# Patient Record
Sex: Female | Born: 1975 | Hispanic: Yes | Marital: Married | Smoking: Never smoker
Health system: Southern US, Community
[De-identification: ages and names within clinical notes are randomized; demographics above are authoritative.]

## PROBLEM LIST (undated history)

## (undated) DIAGNOSIS — Z789 Other specified health status: Secondary | ICD-10-CM

## (undated) HISTORY — PX: TUBAL LIGATION: SHX77

## (undated) HISTORY — PX: NO PAST SURGERIES: SHX2092

---

## 1998-08-11 ENCOUNTER — Inpatient Hospital Stay (INDEPENDENT_AMBULATORY_CARE_PROVIDER_SITE_OTHER): Admit: 1998-08-11 | Disposition: A | Discharge status: Home / Self Care | Payer: Self-pay | Source: Ambulatory Visit

## 1998-09-13 ENCOUNTER — Inpatient Hospital Stay (HOSPITAL_BASED_OUTPATIENT_CLINIC_OR_DEPARTMENT_OTHER)
Admission: RE | Admit: 1998-09-13 | Disposition: A | Discharge status: Home / Self Care | Payer: Self-pay | Source: Ambulatory Visit

## 2007-05-21 ENCOUNTER — Inpatient Hospital Stay (INDEPENDENT_AMBULATORY_CARE_PROVIDER_SITE_OTHER)
Admit: 2007-05-21 | Disposition: A | Discharge status: Home / Self Care | Payer: Self-pay | Source: Ambulatory Visit | Admitting: Obstetrics & Gynecology

## 2007-05-21 LAB — CBC AND DIFFERENTIAL
Basophils Absolute: 0 /mm3 (ref 0.0–0.2)
Basophils: 0 % (ref 0–2)
Eosinophils Absolute: 0.1 /mm3 (ref 0.0–0.7)
Eosinophils: 1 % (ref 0–5)
Granulocytes Absolute: 5.1 /mm3 (ref 1.8–8.1)
Hematocrit: 42.8 % (ref 37.0–47.0)
Hgb: 14.3 G/DL (ref 12.0–16.0)
Immature Granulocytes Absolute: 0
Immature Granulocytes: 0 %
Lymphocytes Absolute: 1.3 /mm3 (ref 0.5–4.4)
Lymphocytes: 19 % (ref 15–41)
MCH: 29.4 PG (ref 28.0–32.0)
MCHC: 33.4 G/DL (ref 32.0–36.0)
MCV: 87.9 FL (ref 80.0–100.0)
MPV: 10.8 FL (ref 9.4–12.3)
Monocytes Absolute: 0.6 /mm3 (ref 0.0–1.2)
Monocytes: 8 % (ref 0–11)
Neutrophils %: 72 % (ref 52–75)
Platelets: 219 /mm3 (ref 140–400)
RBC: 4.87 /mm3 (ref 4.20–5.40)
RDW: 13.1 % (ref 11.5–15.0)
WBC: 7.11 /mm3 (ref 3.50–10.80)

## 2007-05-21 LAB — PRENATAL  WORKUP
AB Screen Gel: NEGATIVE
ABO Rh: O POS

## 2007-05-21 LAB — SICKLE CELL SCREEN: Sickle Screen Test: NEGATIVE

## 2007-05-21 LAB — HEPATITIS B SURFACE ANTIGEN W/ REFLEX TO CONFIRMATION: Hepatitis B Surface Antigen: NEGATIVE

## 2007-05-21 LAB — HIV RAPID: HIV Rapid: NONREACTIVE

## 2007-05-23 LAB — RPR: RPR: NONREACTIVE

## 2007-05-26 LAB — RUBELLA ANTIBODY, IGG

## 2007-06-04 LAB — TRIPLE SCREEN(SOFT)

## 2007-07-02 LAB — TRIPLE SCREEN(SOFT)

## 2007-08-10 ENCOUNTER — Emergency Department: Admit: 2007-08-10 | Payer: Self-pay | Source: Emergency Department | Admitting: Emergency Medicine

## 2007-09-04 LAB — GLUCOSE CHALLENGE: Glucose Challenge: 92 mg/dL

## 2007-09-17 LAB — URINALYSIS
Bilirubin, UA: NEGATIVE
Blood, UA: NEGATIVE
Glucose, UA: NEGATIVE
Ketones UA: NEGATIVE
Leukocyte Esterase, UA: NEGATIVE
Nitrite, UA: NEGATIVE
Protein, UR: NEGATIVE
Specific Gravity UA POCT: 1.003 (ref 1.001–1.035)
Urine pH: 6 (ref 5.0–8.0)
Urobilinogen, UA: NORMAL mg/dL

## 2007-11-10 ENCOUNTER — Observation Stay (HOSPITAL_BASED_OUTPATIENT_CLINIC_OR_DEPARTMENT_OTHER)
Admission: RE | Admit: 2007-11-10 | Disposition: A | Discharge status: Home / Self Care | Payer: Self-pay | Source: Ambulatory Visit

## 2007-11-16 ENCOUNTER — Observation Stay (HOSPITAL_BASED_OUTPATIENT_CLINIC_OR_DEPARTMENT_OTHER)
Admission: AD | Admit: 2007-11-16 | Disposition: A | Discharge status: Home / Self Care | Payer: Self-pay | Source: Ambulatory Visit | Admitting: Obstetrics & Gynecology

## 2007-11-16 LAB — URINALYSIS WITH MICROSCOPIC
Bilirubin, UA: NEGATIVE
Blood, UA: NEGATIVE
Glucose, UA: NEGATIVE
Ketones UA: NEGATIVE
Leukocyte Esterase, UA: NEGATIVE
Nitrite, UA: NEGATIVE
Protein, UR: NEGATIVE
RBC, UA: <=1 /HPF (ref 0–?)
Specific Gravity UA POCT: 1.003 (ref 1.001–1.035)
Squamous Epithelial Cells, Urine: 1 /LPF
Urine pH: 6.5 (ref 5.0–8.0)
Urobilinogen, UA: NORMAL mg/dL
WBC, UA: <=1 /HPF (ref 0–?)

## 2007-11-29 ENCOUNTER — Inpatient Hospital Stay (HOSPITAL_BASED_OUTPATIENT_CLINIC_OR_DEPARTMENT_OTHER)
Admission: RE | Admit: 2007-11-29 | Disposition: A | Discharge status: Home / Self Care | Payer: Self-pay | Source: Ambulatory Visit

## 2007-11-29 LAB — CBC
Hematocrit: 35.5 % — ABNORMAL LOW (ref 37.0–47.0)
Hgb: 11.4 G/DL — ABNORMAL LOW (ref 12.0–16.0)
MCH: 25.7 PG — ABNORMAL LOW (ref 28.0–32.0)
MCHC: 32.1 G/DL (ref 32.0–36.0)
MCV: 80.1 FL (ref 80.0–100.0)
MPV: 11.4 FL (ref 9.4–12.3)
Platelets: 206 /mm3 (ref 140–400)
RBC: 4.43 /mm3 (ref 4.20–5.40)
RDW: 14.7 % (ref 11.5–15.0)
WBC: 8.39 /mm3 (ref 3.50–10.80)

## 2010-12-28 NOTE — H&P (Unsigned)
DATE OF BIRTH:                        01-17-1976      ADMISSION DATE:                     05/21/2007            PATIENT LOCATION:                     [facility]            ATTENDING PHYSICIAN:                  Karolee Stamps, MD            HISTORY OF PRESENT ILLNESS:  This is a 35 year old Hispanic female gravida      5, para 4, last menstrual period 01/24/2007, [facility] 10/31/2007 at      approximately 18 weeks and 4 days who presents for her initial prenatal      care visit.  Patient denied any bleeding, contractions, or leakage of      fluid.  Significant to her history is the fact that she has had no prior      prenatal care.            OBSTETRIC HISTORY:  Her obstetric history consists of 4 prior vaginal      deliveries.  Her first delivery was in 1998 for a 9-pound 1/2-ounce female.      Second delivery was in 2000, a 5-pound female vaginal delivery.  In 2005 she      had a vaginal delivery of an 8-pound 5-ounce female, and in 2006 she had a      vaginal delivery of a 7-pound 5-ounce female.            PAST MEDICAL HISTORY:  She has no medical disorders.  No previous surgery.            ALLERGIES:  No known allergies.            FAMILY HISTORY:  Unremarkable.            SOCIAL HISTORY:  Denies alcohol, drug, or tobacco usage.            REVIEW OF SYSTEMS:  Negative for bleeding, leakage of fluid, or      contractions.            PHYSICAL EXAMINATION:  On admission blood pressure was 105/66, pulse 72,      respirations 20.  She is a well-developed, well-nourished Hispanic female      in no apparent distress.  HEENT exam was within normal limits.  There was      no thyromegaly. Breasts were soft with no palpable masses.  Lungs were      clear.  Heart was regular rate and rhythm with no murmur.  Abdomen was      soft, nontender with palpable uterus 2 fingerbreadths below the umbilicus.      Fetal heart tones were 150 in the left lower quadrant.  There were no      palpable contractions.  Extremities revealed no edema.  Vulva were  within      normal limits.  The vagina revealed no significant discharge or blood.      Cervix was parous with no lesions.  Pap smear and STD cultures were      performed.  The  uterus was 18-week size.            LABORATORY STUDIES:  Hemoglobin 14.3, hematocrit 42.8, platelets 217,000.      She is O positive, antibody screen negative.  She is rubella immune.  STS,      hepatitis B, HIV, sickle cell screen all negative.  She was positive for      100,000 E. coli on 05/23/2007, which was treated with Macrobid.            IMPRESSION:      1.   Grand multipara patient at 18 weeks and 4 days, stable.      2.   Acute urinary tract infection treated on 05/27/2007.            PLAN:      1.   Patient will get a triple screen and level 2 ultrasound.      2.   She will continue prenatal vitamins.      3.   Patient will follow up in the clinic in 4 weeks.                                          ___________________________________          Date Signed: __________      Karolee Stamps, MD  ([id number])            D: 06/03/2007 by Karolee Stamps, MD      T: 06/03/2007 by [id number] (I:[id number]) Dorris Carnes: [id number])      cc:  Karolee Stamps, MD

## 2010-12-28 NOTE — Progress Notes (Unsigned)
Account Number: [id number]      Document ID: [id number]      Admit Date: 11/16/2007      Service Date: 11/16/2007            Patient Location: DISCHARGED 11/16/2007      Patient Type: V            PHYSICIAN/PROVIDER: [facility] MD                  HISTORY OF PRESENT ILLNESS:      The patient is a 35 year old G5, P4-0-0-4 at [redacted] weeks gestational age,      re-dated by a first trimester ultrasound for a due date of November 30, 2007; complaining of dysuria and blood in her urine starting yesterday.      Pregnancy complicated by multiple UTIs per patient.  Records from clinic      unavailable at this time.  The patient states that she had been on      antibiotic approximately 4 months ago; she cannot remember the name of it.      She was told she did not need to take it anymore.  She denies leakage of      fluid.  She is contracting once every 2 hours approximately.  She reports      good fetal movement.  No vaginal bleeding.  She says that she took an      antibiotic that one of her friend's had; she is not sure of the name if it.       She took one of these pills this morning and her symptoms have improved      and she is no longer seeing blood in her urine.            PHYSICAL EXAMINATION:      VITAL SIGNS:  Temperature 97.9, blood pressure 105/68, pulse 92,      respiratory rate 20.      GENERAL:   In no apparent distress.      ABDOMEN:  Fundus nontender.      MUSCULOSKELETAL:  No CVA tenderness.      PELVIC:  Cervix 50% effaced, 2 cm dilated, -3 station.      EXTREMITIES:  No edema.            LABORATORY AND DIAGNOSTIC DATA:      Fetal heart rate 145 and reactive.  Tocodynamometer with occasional      contractions.  Urinalysis:  Negative; less than 1 white blood cell.  Urine      culture pending.            ASSESSMENT:      A 35 year old G5, P4-0-0-4 at 38 weeks with dysuria.            PLAN:      1.  Although the UA is negative, since the patient is symptomatic and has a      history of multiple UTIs,  will give a prescription for Macrobid 100 mg      b.i.d. times 1 week and can follow up urine culture at her next clinic      visit.      2.  Fetal heart rate reactive.      3.  The patient is not contracting.  Labor precautions reviewed.      4.  Will discharge home to follow up in clinic this week.  Discussed with  Dr. _____.                              _______________________________     Date/Time Signed: _____________      Physician  [facility] Clinic MD ([id number])            D:  11/16/2007 19:04 PM by Lynita Lombard. Lilyan Punt, MD ([id number])      T:  11/16/2007 22:49 PM by MDI = [id number]          (Conf: [id number]) (Doc ID: [id number])                  cc:

## 2010-12-28 NOTE — Progress Notes (Unsigned)
Account Number: [id number]      Document ID: [id number]      Admit Date: 11/10/2007      Service Date: 11/10/2007            Patient Location: [id number]      Patient Type: I            PHYSICIAN/PROVIDER: Edwena Felty MD                  This patient is a 35 year old G5, P4-0-0-4.  She is at 37 weeks, here for      rule out labor.  The patient was recently checked in the clinic yesterday      and was found to be 2 cm dilated.  The patient denies any leakage of fluid,      vaginal bleeding, or decreased fetal movement.  The patient has      intermittent contractions.  Blood pressure here is 110/68.  Baby is      reactive in the 130s.  The patient has no contractions on the monitor.  The      patient's physical exam is 50, 2, and -2.  The patient was given strict      labor and preeclampsia precautions and told to follow up in the clinic next      week.            Discussed the patient with Dr. Marquita Palms.                              _______________________________     Date/Time Signed: _____________      Edwena Felty MD ([id number])            D:  11/10/2007 17:52 PM by Geoffery Lyons, MD ([id number])      T:  11/10/2007 18:19 PM by [id number]          Everlean Cherry: [id number]) (Doc ID: [id number])                  cc:

## 2011-03-10 ENCOUNTER — Emergency Department: Payer: Self-pay

## 2011-03-10 DIAGNOSIS — R002 Palpitations: Secondary | ICD-10-CM | POA: Insufficient documentation

## 2011-03-10 LAB — BASIC METABOLIC PANEL
BUN: 9 mg/dL (ref 8–20)
CO2: 23 mEq/L (ref 21–30)
Calcium: 9.8 mg/dL (ref 8.6–10.2)
Chloride: 107 mEq/L (ref 98–107)
Creatinine: 0.6 mg/dL (ref 0.6–1.5)
Glucose: 115 mg/dL — ABNORMAL HIGH (ref 70–100)
Potassium: 4.4 mEq/L (ref 3.6–5.0)
Sodium: 145 mEq/L (ref 136–146)

## 2011-03-10 LAB — CBC AND DIFFERENTIAL
Basophils Absolute Automated: 0.02 10*3/uL (ref 0.00–0.20)
Basophils Automated: 0 % (ref 0–2)
Eosinophils Absolute Automated: 0.07 10*3/uL (ref 0.00–0.70)
Eosinophils Automated: 1 % (ref 0–5)
Hematocrit: 41.6 % (ref 37.0–47.0)
Hgb: 14 g/dL (ref 12.0–16.0)
Immature Granulocytes Absolute: 0.01 10*3/uL
Immature Granulocytes: 0 % (ref 0–1)
Lymphocytes Absolute Automated: 2.35 10*3/uL (ref 0.50–4.40)
Lymphocytes Automated: 24 % (ref 15–41)
MCH: 29.5 pg (ref 28.0–32.0)
MCHC: 33.7 g/dL (ref 32.0–36.0)
MCV: 87.6 fL (ref 80.0–100.0)
MPV: 10.9 fL (ref 9.4–12.3)
Monocytes Absolute Automated: 0.28 10*3/uL (ref 0.00–1.20)
Monocytes: 3 % (ref 0–11)
Neutrophils Absolute: 6.9 10*3/uL (ref 1.80–8.10)
Neutrophils: 72 % (ref 52–75)
Nucleated RBC: 0 /100 WBC
Platelets: 275 10*3/uL (ref 140–400)
RBC: 4.75 10*6/uL (ref 4.20–5.40)
RDW: 14 % (ref 12–15)
WBC: 9.63 10*3/uL (ref 3.50–10.80)

## 2011-03-10 LAB — URINALYSIS WITH MICROSCOPIC
Bilirubin, UA: NEGATIVE
Blood, UA: NEGATIVE
Glucose, UA: NEGATIVE
Ketones UA: NEGATIVE
Nitrite, UA: NEGATIVE
Protein, UR: NEGATIVE
Specific Gravity UA POCT: 1.011 (ref 1.001–1.035)
Urine pH: 8 (ref 5.0–8.0)
Urobilinogen, UA: NORMAL mg/dL

## 2011-03-10 LAB — POCT PREGNANCY TEST, URINE HCG: POCT Pregnancy HCG Test, UR: NEGATIVE

## 2011-03-10 LAB — IHS D-DIMER: D-Dimer: 0.32 ug/mL FEU (ref 0.00–0.49)

## 2011-03-10 LAB — GFR: EGFR: >60

## 2011-03-10 NOTE — ED Notes (Signed)
Acute onset of dull L sided CP while sitting shortly after eating; pt extremely anxious and tachypneic on arrival; "pain feels like she's being suffocated"

## 2011-03-11 ENCOUNTER — Emergency Department
Admission: EM | Admit: 2011-03-11 | Discharge: 2011-03-11 | Disposition: A | Discharge status: Home / Self Care | Payer: Self-pay | Attending: Emergency Medicine | Admitting: Emergency Medicine

## 2011-03-11 ENCOUNTER — Emergency Department: Payer: Self-pay

## 2011-03-11 DIAGNOSIS — R002 Palpitations: Secondary | ICD-10-CM

## 2011-03-11 NOTE — ED Provider Notes (Signed)
History     Chief Complaint   Patient presents with   . Chest Pain     HPI Comments: 35 yo F c/o heart racing palpitations associated with lightheadedness, nausea, and SOB onset today while sitting. Pt reports sxs have since resolved, and she now only feels fatigue. Denies vomiting, fever, cough, or other complaints.    Patient is a 35 y.o. female presenting with palpitations. The history is provided by the patient. A language interpreter was used.   Palpitations   This is a new problem. Episode onset: Today. Associated symptoms include nausea and shortness of breath. Pertinent negatives include no fever, no vomiting and no cough.       History reviewed. No pertinent past medical history.    History reviewed. No pertinent past surgical history.    No family history on file.    No current facility-administered medications for this encounter.     No current outpatient prescriptions on file.       No Known Allergies    History   Substance Use Topics   . Smoking status: Never Smoker    . Smokeless tobacco: Not on file   . Alcohol Use: No       Review of Systems   Constitutional: Positive for fatigue. Negative for fever.   Respiratory: Positive for shortness of breath. Negative for cough.    Cardiovascular: Positive for palpitations.   Gastrointestinal: Positive for nausea. Negative for vomiting.   Neurological: Positive for light-headedness.   [all other systems reviewed and are negative        Physical Exam   BP 108/68  Pulse 60  Temp(Src) 98.1 F (36.7 C) (Oral)  Resp 16  Ht 1.651 m  Wt 92.987 kg  BMI 34.11 kg/m2  SpO2 100%  LMP 02/24/2011  Pulse oximetry > 95% normal    Physical Exam   [nursing notereviewed.  Constitutional: She is oriented to person, place, and time. She appears well-developed and well-nourished.   HENT:   Head: Normocephalic and atraumatic.   Eyes: EOM are normal. Pupils are equal, round, and reactive to light.   Neck: Normal range of motion.   Cardiovascular: Normal rate, regular  rhythm, normal heart sounds and intact distal pulses.    No murmur heard.  Pulmonary/Chest: Effort normal. No stridor. No respiratory distress. She has no wheezes. She has no rales.   Abdominal: Soft. Bowel sounds are normal. She exhibits no distension. There is no tenderness. There is no rebound and no guarding.   Musculoskeletal: Normal range of motion. She exhibits no edema.   Neurological: She is alert and oriented to person, place, and time. She exhibits normal muscle tone.   Skin: Skin is warm and dry. No rash noted.   Psychiatric: She has a normal mood and affect. Her behavior is normal.       ED Course   Procedures  EKG:  Normal sinus rhythm, normal rate, normal axis, normal intervals, normal qrs, normal ST segments.  No evidence of preexcitation states or brugada.    Impression:Normal EKG.    Results:  Results     Procedure Component Value Units Date/Time    D-Dimer [[id number]] Collected:03/10/11 2057     D-Dimer 0.32  ug/mL FEU Updated:03/10/11 2140    Basic Metabolic Panel (BMP) [[id number]]  (Abnormal) Collected:03/10/11 2057    Specimen Information:Blood Updated:03/10/11 2128     Glucose 115 (H) mg/dL      BUN 9 mg/dL      Creatinine 0.6  mg/dL      Calcium 9.8 mg/dL      Sodium 563 mEq/L      Potassium 4.4 mEq/L      Chloride 107 mEq/L      CO2 23 mEq/L     GFR [[id number]] Collected:03/10/11 2057     EGFR >60   Updated:03/10/11 2128    UA with Micro [[id number]]  (Abnormal) Collected:03/10/11 2057    Specimen Information:Urine Updated:03/10/11 2120     Urine Type Clean Catch      Color, UA Yellow      Clarity, UA Clear      Specific Gravity, UR 1.011      Urine pH 8.0      Leukocytes, UA Trace (A)      Nitrite, UA Negative      Protein, UA Negative      Glucose, UA Negative      Ketones UA Negative      Urobilinogen, UA Normal mg/dL      Bilirubin, UA Negative      Blood, UA Negative      RBC, UA 0 - 5 /HPF      WBC, UA 0 - 5 /HPF      Squamous Epithelial Cells, Urine 6 - 10 /HPF     CBC with Differential  [[id number]] Collected:03/10/11 2057    Specimen Information:Blood Updated:03/10/11 2119     WBC 9.63 x10 3/uL      RBC 4.75 x10 6/uL      Hgb 14.0 g/dL      Hematocrit 93.2 %      MCV 87.6 fL      MCH 29.5 pg      MCHC 33.7 g/dL      RDW 14 %      Platelets 275 x10 3/uL      MPV 10.9 fL      Neutrophils 72 %      Lymphocytes Automated 24 %      Monocytes Automated 3 %      Eosinophils Automated 1 %      Basophils Automated 0 %      Immature Granulocyte 0 %      Nucleated RBC 0 /100 WBC      Neutrophils Absolute 6.90 x10 3/uL      Abs Lymph Automated 2.35 x10 3/uL      Abs Mono Automated 0.28 x10 3/uL      Abs Eos Automated 0.07 x10 3/uL      Absolute Baso Automated 0.02 x10 3/uL      Absolute Immature Granulocyte 0.01 x10 3/uL     Urine HCG POC [[id number]] Collected:03/10/11 2105     POCT QC Pass Updated:03/10/11 2105     POCT Pregnancy HCG Test, UR Negative      Comment:        Result:     Negative Value is Normal in Healthy Males or Healthy non-pregnant Females        Radiology Results (24 Hour)     Procedure Component Value Units Date/Time    Chest 2 Views [[id number]] Collected:03/10/11 1808    Order Status:Completed  Updated:03/10/11 1815    Narrative:    HISTORY: Chest pain     PA and lateral views of the chest show clear lungs.  Costophrenic and  cardiophrenic angles are sharp.  Cardiomediastinal silhouette is not  enlarged.  There is scoliosis of the thoracic spine.       Impression:  No acute cardiopulmonary processes.            MDM  Number of Diagnoses or Management Options     Amount and/or Complexity of Data Reviewed  Clinical lab tests: ordered and reviewed  Tests in the radiology section of CPT: ordered and reviewed  Independent visualization of images, tracings, or specimens: yes    Risk of Complications, Morbidity, and/or Mortality  Presenting problems: high  Diagnostic procedures: high  Management options: high            Palpitations, near syncope    DDX: Anxiety, dysrhythmia, dehydration,   PTx, pericarditis, unlikely ACS, PE (perc rule negative), aortic dissection  Plan:  CXR, EKG  LAbs ordered from triage    All studies were unremarkable.    Pt. Improved with treatment offered in the emergency department and a comprehensive emergency department evaluation did not reveal any immediate life threats.  Due to this improvement they were discharged home.   They were instructed to follow up with their regular doctor in 2 days and return to the ER if they developed any new concerning symptoms or if they had any other concerns.  They verbalized understanding of the plan of care and felt comfortable with the plan.  Pt. Given a list of primary care physicians.    12:58 AM  03/11/2011  BP 108/68  Pulse 60  Temp(Src) 98.1 F (36.7 C) (Oral)  Resp 16  Ht 1.651 m  Wt 92.987 kg  BMI 34.11 kg/m2  SpO2 100%  LMP 02/24/2011            12:27 AM12/31/2012  I personally performed the services documented by the scribe listed below.  -Rosalita Chessman MD      Rosalita Chessman, MD is the first provider for [facility]     I am scribing for Rosalita Chessman, MD on [facility]    Roslynn Amble (ED Scribe)    1:01 AM  03/11/2011        Rosalita Chessman, MD  03/11/11 2140

## 2011-03-11 NOTE — Discharge Instructions (Signed)
Follow up with one of the free clinics listed or call the [facility] Referral Line for a referral to doctor to follow up for further evaluations.     Return to the ER for any new or worsening symptoms.    ----------------------------------- Begin Special Instructions ----------------------------------    [place] Referral Line     [place] Referral Line     1.  Usted ha sido referido a un mdico de atencin primaria o a un especialista para recibir Aeronautical engineer. Por favor llame a la lnea para referidos de [place] y ellos podrn ayudarle a Clinical research associate un mdico en su rea con el cual pueda hacer este seguimiento.     [place]: [phone] or [phone]   1.  You have been referred to a primary care doctor or a specialist for follow-up care. Please call the [facility] referral line and they will be able to help you find a physician in your area that you can follow up with.    Phone: [phone] or [phone]           ----------------------------------- Begin Special Instructions ----------------------------------  Clinics: [facility]    COMMUNITY HEALTH CARE NETWORK OFFICES   The [facility] Network is a partnership of health professionals, physicians, hospitals and local government. It was formed to provide primary health services for low income, uninsured [place] residents who cannot afford primary medical care services for themselves and their families.    Applications are taken in person at the health centers. The list of enrollment requirements for the [facility] in [place] and Spanish are available.     [facility]  [address]  [place], [place] [address]  Phone: [phone]   FAX: [phone]  TTY: [phone]  Hours:   Monday and Tuesday: 11:00 a.m. - 7:30 p.m.  Wednesday, Thursday, Friday: 8:00 a.m. - 4:30 p.m.     [facility]  [address], [address]  [place], [place] [address]  Phone: [phone]  FAX:  [phone]  TTY: [phone]  Hours:   Monday and Tuesday: 11:00 a.m. - 7:30 p.m.  Wednesday, Thursday, Friday: 8:00 a.m. - 4:30 p.m.     [facility]  [address]  [place], [place] [address]  PHONE: [phone]   FAX: [phone]  TTY: [phone]    Monday and Tuesday: 11:00 a.m. - 7:30 p.m.  Wednesday,Thursday, Friday: 8:00 a.m. - 4:30 p.m.    --------------------------------------------------------  --------------------------------------------------------  DOCUMENTATION NEEDED FOR ENROLLMENT INTO THE COMMUNITY HEALTH CARE NETWORK    Identification for [facility] family member: (Please bring ONE of the following)  - Social Security Card  - Scientist, forensic  - Baptismal Record  - School Report Card  - WIC Record  - Photo ID Card    If you are not the biological parent of one or more minors living in your home, please bring proof of adoption, guardianship or foster parent status.    Intent to remain in [facility] for [facility] family member: (Please bring IF applicable to you and/or any family member)    - Passport  - Visa  - INS documentation    Depending on your situation, this documentation may or may not be required. If you have a passport, visa or any documentation from the INS regarding your residency status, please bring it with you.  Proof of 63-Month Residence in [place]: (Please bring ONE of the following)  Barista with your name and address   Mortgage or tax bill   Letter from landlord   Letter from homeless shelter   Notarized statement from person with whom you are living PLUS a utility bill with the name and address of the person with whom you are living   Proof of Income: (for [employer] employed household member, for [employer] job held)   Most recent income tax return    AND one of the following:   Pay stubs for the past month   Income and Engineer, manufacturing systems form (provided by enrollment office)   Copy of social security and/or SSI  award letter, General Relief check, proof of TANF payment, copy of pension payments, court order regarding alimony and/or child support payments to you   If you are currently unemployed and supported by a friend or family member, bring in a notarized statement from the person supporting you stating how long they have helped you and how long they plan to continue supporting you.   Proof of Insurance: (for [employer] household member)   Proof that you are ineligible for Medicaid, Medallion or FAMIS   If employed, please have your employer complete the income and insurance verification form    Other Required Documentation:   Other: __________________________   Two (2) months of most recent bank statements, both checking and savings   Copies of on-going monthly bills, including rent/mortgage, utilities, phones, car payments, etc.            ----------------------------------- Begin Special Instructions ----------------------------------    Palpitaciones     Palpitations     1.  Tiene palpitaciones.   1.  You have been diagnosed with "palpitations."             2.  Las palpitaciones son latidos que se sienten raros en su corazn. Se deben generalmente a latidos cardacos extra que ocurrieron antes de lo esperado. A estos se les llama "contracciones auriculares prematuras" o "contracciones ventriculares prematuras", dependiendo de la parte del corazn en la que se originan. Normalmente desaparecen sin ninguna intervecin. A veces se relacionan con el estrs, por la falta de dormir, o un exceso de cafena. Muchos medicamentos que se obtienen en la farmacia sin receta para el resfrio, pastillas de dieta, y suplementos "naturales" vitamnicos "naturales" contienen estimulantes, regularmente efedrina (Efedrina es tambin concido por el nombre tradicional [place], [place] huang) que pueden provocar estos sntomas.   2.  Palpitations are beats in the chest that feel funny or strange. They are generally caused by  extra heartbeats that occur earlier than normal. These are called either "premature atrial contractions" or "premature ventricular contractions," depending on where in the heart they happen. Palpitations usually go away on their own and do not cause any serious problems. They are sometimes related to stress, lack of sleep, or too much caffeine. Many over-the-counter cold medications, diet pills, and "natural" vitamin supplements have stimulants, usually ephedrine (Ephedrine is also known by its traditional [place] name, Ma huang) that can cause these symptoms.             3.  Las palpitaciones se sienten Haematologist. Algunos pacientes los describen como una sensacin de "mariposas" en su pecho. Otros los describen como si corazn "saltara" en su pecho. Las Librarian, academic ocurrir a menudo pero deberan durar slo uno o dos segundos. No deberan causar dolor de pecho, mareos, o desmayos.   3.  Palpitations feel different for different people. Some patients  describe the feeling of "butterflies" in the chest. Others say it feels as if the heart is "flipping over" in the chest. Palpitations may happen often but should last only a second or two each time. They should not cause any chest pain, lightheadedness, dizziness or fainting.             4.  No hay tratamiento especfico para las palpitaciones pero usted evitar la cafena, los medicamentos para el resfro, los estimulantes naturals y el chocolate.   4.  There is no specific treatment for palpitations but you should avoid all caffeine, cold medications, natural stimulants, and chocolate.             5.  Debera hacer un seguimiento con su doctor primario la semana prxima para asegurarse de que sus sntomas estn desapareciendo. Adems, en algunos casos un monitor Holter se puede pedir. Un monitor Holter corazn porttil que registra el ritmo cardiaco. Usted tendr que ver a su doctor primario para obtener los  [employer] de la prueba.   5.  You should follow up with your primary doctor in the next week to make sure that your symptoms are getting better. In some cases, a Holter monitor may be ordered. A Holter monitor is a portable heart monitor that records your heart s electrical rhythm. You will need to see your regular doctor to get the results of the test.             6.  DEBERIA BUSCAR ATENCION MEDICA INMEDIATAMENTE, O AQUI O EN LA SALA DE EMERGENCIAS MAS CERCANA, SI TIENE ALGUNO DE ESTOS SINTOMAS:   6.  YOU SHOULD SEEK MEDICAL ATTENTION IMMEDIATELY, EITHER HERE OR AT THE NEAREST EMERGENCY DEPARTMENT, IF ANY OF THE FOLLOWING OCCURS:     Mareos o sensacin de ir a desmayarse.    * Lightheadedness or the feeling that you might faint.     Un ritmo cardaco extraamente rpido o lento.    * An unusually fast or slow heart rate.     Dolor de pecho o falta de aire.    * Chest pain or shortness of breath.     Un aumento de las palpitaciones cuando hace ejercicio.    * An increase in palpitations when you exercise.     Cualquier otro sntoma o problema que empeore.    * Any other worsening symptoms or concerns.

## 2011-03-13 LAB — ECG 12-LEAD
Atrial Rate: 83 {beats}/min
P Axis: 55 degrees
P-R Interval: 138 ms
Q-T Interval: 362 ms
QRS Duration: 86 ms
QTC Calculation (Bezet): 425 ms
R Axis: 64 degrees
T Axis: 60 degrees
Ventricular Rate: 83 {beats}/min

## 2011-03-19 ENCOUNTER — Emergency Department: Payer: Self-pay

## 2011-03-19 ENCOUNTER — Emergency Department
Admission: EM | Admit: 2011-03-19 | Discharge: 2011-03-19 | Disposition: A | Discharge status: Home / Self Care | Payer: Self-pay | Attending: Emergency Medicine | Admitting: Emergency Medicine

## 2011-03-19 DIAGNOSIS — R55 Syncope and collapse: Secondary | ICD-10-CM | POA: Insufficient documentation

## 2011-03-19 LAB — COMPREHENSIVE METABOLIC PANEL
ALT: 40 U/L — ABNORMAL HIGH (ref 3–36)
AST (SGOT): 25 U/L (ref 10–41)
Albumin/Globulin Ratio: 1.5 (ref 1.1–1.8)
Albumin: 4.6 g/dL (ref 3.4–4.9)
Alkaline Phosphatase: 83 U/L (ref 43–112)
BUN: 7 mg/dL — ABNORMAL LOW (ref 8–20)
Bilirubin, Total: 0.6 mg/dL (ref 0.1–1.0)
CO2: 24 mEq/L (ref 21–30)
Calcium: 9.6 mg/dL (ref 8.6–10.2)
Chloride: 108 mEq/L — ABNORMAL HIGH (ref 98–107)
Creatinine: 0.6 mg/dL (ref 0.6–1.5)
Globulin: 3 g/dL (ref 2.0–3.7)
Glucose: 104 mg/dL — ABNORMAL HIGH (ref 70–100)
Potassium: 4 mEq/L (ref 3.6–5.0)
Protein, Total: 7.6 g/dL (ref 6.0–8.0)
Sodium: 146 mEq/L (ref 136–146)

## 2011-03-19 LAB — CBC AND DIFFERENTIAL
Basophils Absolute Automated: 0.02 10*3/uL (ref 0.00–0.20)
Basophils Automated: 0 % (ref 0–2)
Eosinophils Absolute Automated: 0.16 10*3/uL (ref 0.00–0.70)
Eosinophils Automated: 2 % (ref 0–5)
Hematocrit: 40.9 % (ref 37.0–47.0)
Hgb: 14 g/dL (ref 12.0–16.0)
Immature Granulocytes Absolute: 0.01 10*3/uL
Immature Granulocytes: 0 % (ref 0–1)
Lymphocytes Absolute Automated: 1.83 10*3/uL (ref 0.50–4.40)
Lymphocytes Automated: 20 % (ref 15–41)
MCH: 30.2 pg (ref 28.0–32.0)
MCHC: 34.2 g/dL (ref 32.0–36.0)
MCV: 88.3 fL (ref 80.0–100.0)
MPV: 10.8 fL (ref 9.4–12.3)
Monocytes Absolute Automated: 0.44 10*3/uL (ref 0.00–1.20)
Monocytes: 5 % (ref 0–11)
Neutrophils Absolute: 6.77 10*3/uL (ref 1.80–8.10)
Neutrophils: 73 % (ref 52–75)
Nucleated RBC: 0 /100 WBC
Platelets: 297 10*3/uL (ref 140–400)
RBC: 4.63 10*6/uL (ref 4.20–5.40)
RDW: 14 % (ref 12–15)
WBC: 9.23 10*3/uL (ref 3.50–10.80)

## 2011-03-19 LAB — URINALYSIS WITH MICROSCOPIC
Bilirubin, UA: NEGATIVE
Blood, UA: NEGATIVE
Glucose, UA: NEGATIVE
Ketones UA: NEGATIVE
Leukocyte Esterase, UA: NEGATIVE
Nitrite, UA: NEGATIVE
Protein, UR: NEGATIVE
Specific Gravity UA POCT: 1.005 (ref 1.001–1.035)
Urine pH: 7.5 (ref 5.0–8.0)
Urobilinogen, UA: NORMAL mg/dL

## 2011-03-19 LAB — I-STAT TROPONIN: i-STAT Troponin: 0.01 ng/mL (ref 0.00–0.09)

## 2011-03-19 LAB — POCT PREGNANCY TEST, URINE HCG: POCT Pregnancy HCG Test, UR: NEGATIVE

## 2011-03-19 LAB — GFR: EGFR: >60

## 2011-03-19 NOTE — ED Provider Notes (Signed)
Maximino Sarin am scribing for Latanya Presser, MD on [facility]    Brett Fairy  1:56 PM  03/19/2011     I personally performed the services documented. Brett Fairy is scribing for me on [employer] [facility]. I reviewed and confirm the accuracy of the information in this medical record.    Latanya Presser, MD  1:56 PM    Initial assessment by Dr. Mal Amabile at [phone].  History     History of Present Illness     Patient Identification  Pamela Butler is a 36 y.o. female.    Patient information was obtained from patient.  History/Exam limitations: spanish speaking only.  Patient presented to the Emergency Department by private vehicle.    Chief Complaint   35yo F no pmh c/o fatigue onset today after taking Propanolol. Pt sts associated sx of weakness and dizziness. Pt saw her PMD today and was prescribed Propanolol. Pt denies CP or SOB. Pt was seen in the ED 9 days pta and her labs, d-dimer, and chest xray were nl. Pt was diagnosed with palpitations and discharged home.     PMD: Dorena Bodo    No past medical history on file.  No family history on file.  No current facility-administered medications for this encounter.     No current outpatient prescriptions on file.     No Known Allergies  History   Substance Use Topics   . Smoking status: Never Smoker    . Smokeless tobacco: Not on file   . Alcohol Use: No       Review of Systems   Constitutional: Positive for fatigue.   Respiratory: Negative for shortness of breath.    Cardiovascular: Negative for chest pain.   Neurological: Positive for dizziness and weakness.   [all other systems reviewed and are negative        Physical Exam   BP 110/58  Pulse 66  Temp(Src) 98 F (36.7 C) (Oral)  Resp 16  SpO2 100%  LMP 02/24/2011    Physical Exam   [nursing notereviewed.  Constitutional: She is oriented to person, place, and time. She appears well-developed and well-nourished.   Eyes: EOM are normal. Pupils are equal, round, and reactive to light.   Neck:  Normal range of motion. Neck supple.   Cardiovascular: Normal rate, regular rhythm and normal heart sounds.    Pulmonary/Chest: Effort normal and breath sounds normal.   Abdominal: Soft. Bowel sounds are normal. She exhibits no distension. There is no tenderness.   Musculoskeletal: Normal range of motion. She exhibits no edema and no tenderness.   Neurological: She is alert and oriented to person, place, and time.   Skin: Skin is warm and dry.   Psychiatric: She has a normal mood and affect.       ED Course   Procedures    MDM  Number of Diagnoses or Management Options  Syncope:   Diagnosis management comments: Pt seen here on 03/11/11 for palpitations, had negative workup including EKG/Ddimer/CXR. Saw PMD today, started on propranolol 40mg  qd. Took it today, then later felt weak/dizzy and passed out. Denies CP/SOB. Labs/EKG are normal today. Not pregnant. HR 60. Syncope likely secondary to propranolol given HR 60. Will have her d/c propranolol and f/u with PMD/cards.    Physician/Midlevel provider first contact with patient: (not recorded)      Treatment Team: Scribe: Brett Fairy    EKG: nl sinus 60; no acute ST  or T wave changes.     Latanya Presser, MD  03/19/11 [phone]

## 2011-03-19 NOTE — Discharge Instructions (Signed)
You were seen today by Dr. Mal Amabile.  Stop taking the Propanolol and follow up with your doctor.  Return to the ER if symptoms worsen.     Usted fue visto hoy por el Dr. Mal Amabile.  Deje de tomar el propanolol y el seguimiento con su mdico.  Volver a la sala de emergencias si los sntomas empeoran.      Sncope     Syncope     1.  Se le ha diagnosticado un sncope.   1.  You have been diagnosed with syncope (pronounced SINK-uh-pee).             2.  ste es el trmino mdico para una prdida rpida de conciencia o un episodio de [place]. Existen muchas causas de un sncope. Algunas de stas ponen en riesgo la vida mientras que otras no son serios. La mayora de los pacientes con causas que ponen en riesgo su vida son ingresados en el hospital para realizar ms exmenes. Los pacientes con causas que no ponen en riesgo su vida pueden irse a la casa.   2.  This is the medical term for a rapid loss of consciousness or a fainting episode. There are many causes of syncope. Some of these are life-threatening and others are not serious. Most patients with life-threatening causes are admitted to the hospital for further testing. Patients without life-threatening conditions may be sent home.             3.  Algunas causas que no ponen en riesgo la vida son la deshidratacin, la insolacin, episodios vasovagales (desmayos simples) y efectos secundarios de nuevos medicamentos.    3.  Some non-life threatening causes of syncope include dehydration, heat exhaustion, vasovagal events (simple fainting), and side-effects from new medication.             4.  El tratamiento de un sncope depende de la causa. En general, es una buena idea beber muchos lquidos y evitar actividades intensas durante al menos 24 horas despus de un desmayo.   4.  Treatment of syncope depends on the cause. In general it is a good idea to drink plenty of fluids and avoid strenuous activity for at least 24 hours after  fainting.             5.  Dle seguimiento con su mdico familiar dentro de los siguientes 3 das.    5.  Follow up with your regular doctor within 3 days.             6.  DEBE BUSCAR ATENCIN MDICA INMEDIATAMENTE, AQU O EN LA SALA DE EMERGENCIAS MS CERCANA, SI SE PRESENTA CUALQUIERA DE LAS SIGUIENTES SITUACIONES:   6.  YOU SHOULD SEEK MEDICAL ATTENTION IMMEDIATELY, EITHER HERE OR AT THE NEAREST EMERGENCY DEPARTMENT, IF ANY OF THE FOLLOWING OCCURS:      * Tiene episodios de desmayo continuos.    * You continue to faint (pass out).      * Tiene dolor de pecho al [employer].    * You have any chest pain with your fainting.      * Nota palpitaciones o latidos extraos antes de desmayarse.    * You notice any palpitations or strange heart beats before fainting.      * Nota sangre en sus heces. La sangre puede ser de color rojo brillante, rojo oscuro o color negro y Air traffic controller.     * You notice any blood in your stools. Blood may be bright red  or dark red or black and tar-like.      * Se siente dbil, mareado, o no mejora como esperaba.    * You feel weak or lightheaded or do not improve as expected.      * Empeora o tiene otras molestias.    * You become worse or have other concerns.

## 2011-03-19 NOTE — ED Notes (Signed)
Patient discharged to home

## 2011-03-19 NOTE — ED Notes (Signed)
Patient ambulates without assistance to the bathroom
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
## 2011-03-20 LAB — ECG 12-LEAD
Atrial Rate: 62 {beats}/min
P Axis: 52 degrees
P-R Interval: 148 ms
Q-T Interval: 396 ms
QRS Duration: 86 ms
QTC Calculation (Bezet): 401 ms
R Axis: 72 degrees
T Axis: 51 degrees
Ventricular Rate: 62 {beats}/min

## 2012-09-25 ENCOUNTER — Ambulatory Visit: Payer: Self-pay | Admitting: Primary Care

## 2012-10-15 ENCOUNTER — Emergency Department: Payer: Self-pay | Admitting: Emergency Medicine

## 2012-10-15 LAB — BASIC METABOLIC PANEL
BUN: 6 mg/dL — ABNORMAL LOW (ref 7–18)
Calcium, Total: 9 mg/dL (ref 8.5–10.1)
Chloride: 109 mmol/L — ABNORMAL HIGH (ref 98–107)
Co2: 28 mmol/L (ref 21–32)
Creatinine: 0.59 mg/dL — ABNORMAL LOW (ref 0.60–1.30)
EGFR (African American): >60
Osmolality: 278 (ref 275–301)
Sodium: 140 mmol/L (ref 136–145)

## 2012-10-15 LAB — URINALYSIS, COMPLETE
Bilirubin,UR: NEGATIVE
Glucose,UR: NEGATIVE mg/dL (ref 0–75)
Ketone: NEGATIVE
Ph: 6 (ref 4.5–8.0)
Specific Gravity: 1.006 (ref 1.003–1.030)
Squamous Epithelial: <1
WBC UR: 43 /HPF (ref 0–5)

## 2012-10-15 LAB — CBC
HCT: 40.9 % (ref 35.0–47.0)
MCH: 30 pg (ref 26.0–34.0)
MCHC: 34.2 g/dL (ref 32.0–36.0)
Platelet: 258 10*3/uL (ref 150–440)
RDW: 13.1 % (ref 11.5–14.5)

## 2012-10-15 LAB — PREGNANCY, URINE: Pregnancy Test, Urine: NEGATIVE m[IU]/mL

## 2013-02-24 ENCOUNTER — Encounter (HOSPITAL_COMMUNITY): Payer: Self-pay | Admitting: General Practice

## 2013-02-24 ENCOUNTER — Inpatient Hospital Stay (HOSPITAL_COMMUNITY)
Admission: AD | Admit: 2013-02-24 | Discharge: 2013-02-24 | Disposition: A | Discharge status: Home / Self Care | Payer: Managed Care, Other (non HMO) | Source: Ambulatory Visit | Attending: Obstetrics & Gynecology | Admitting: Obstetrics & Gynecology

## 2013-02-24 DIAGNOSIS — O99891 Other specified diseases and conditions complicating pregnancy: Secondary | ICD-10-CM | POA: Insufficient documentation

## 2013-02-24 DIAGNOSIS — Z3490 Encounter for supervision of normal pregnancy, unspecified, unspecified trimester: Secondary | ICD-10-CM

## 2013-02-24 DIAGNOSIS — R109 Unspecified abdominal pain: Secondary | ICD-10-CM | POA: Insufficient documentation

## 2013-02-24 DIAGNOSIS — N949 Unspecified condition associated with female genital organs and menstrual cycle: Secondary | ICD-10-CM

## 2013-02-24 LAB — URINALYSIS, ROUTINE W REFLEX MICROSCOPIC
Bilirubin Urine: NEGATIVE
Glucose, UA: NEGATIVE mg/dL
Ketones, ur: NEGATIVE mg/dL
Leukocytes, UA: NEGATIVE
Nitrite: NEGATIVE
Specific Gravity, Urine: 1.015 (ref 1.005–1.030)
pH: 6 (ref 5.0–8.0)

## 2013-02-24 LAB — WET PREP, GENITAL: Yeast Wet Prep HPF POC: NONE SEEN
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
## 2013-02-24 NOTE — MAU Note (Signed)
Pt states she did not have a period for 6 months and last month she had a period and she want to know how far along she is.  She has complaints of pain on her left side
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
## 2013-02-25 LAB — GC/CHLAMYDIA PROBE AMP
CT Probe RNA: NEGATIVE
GC Probe RNA: NEGATIVE

## 2013-03-11 NOTE — L&D Delivery Note (Signed)
Delivery Note At 7:38 AM a viable female was delivered via Vaginal, Spontaneous Delivery (Presentation: ; Occiput Anterior).  APGAR: 8, 9; weight .   Placenta status: Intact, Spontaneous.  Cord: 3 vessels with the following complications: None.    Anesthesia: Epidural  Episiotomy: None Lacerations: None Suture Repair: na Est. Blood Loss (mL): 300  Mom to postpartum.  Baby to Couplet care / Skin to Skin.   Pt pushed with good maternal effort to deliver a liveborn female via NSVD with spontaneous cry.   Baby placed on maternal abdomen.  Delayed cord clamping performed.  Cord cut by myself.  Placenta delivered intact with 3V cord via traction and pitocin.  no tears. No complications.  Mom and baby to postpartum.  Linda [name] 08/01/2013, 8:50 AM

## 2013-04-08 ENCOUNTER — Other Ambulatory Visit (HOSPITAL_COMMUNITY): Payer: Self-pay | Admitting: Nurse Practitioner

## 2013-04-08 DIAGNOSIS — Z3689 Encounter for other specified antenatal screening: Secondary | ICD-10-CM

## 2013-04-08 LAB — OB RESULTS CONSOLE HIV ANTIBODY (ROUTINE TESTING): HIV: NONREACTIVE

## 2013-04-08 LAB — OB RESULTS CONSOLE ANTIBODY SCREEN: Antibody Screen: NEGATIVE

## 2013-04-08 LAB — OB RESULTS CONSOLE ABO/RH: RH TYPE: POSITIVE

## 2013-04-08 LAB — OB RESULTS CONSOLE RPR: RPR: NONREACTIVE

## 2013-04-08 LAB — OB RESULTS CONSOLE RUBELLA ANTIBODY, IGM: Rubella: IMMUNE

## 2013-04-08 LAB — OB RESULTS CONSOLE HEPATITIS B SURFACE ANTIGEN: Hepatitis B Surface Ag: NEGATIVE

## 2013-04-09 ENCOUNTER — Ambulatory Visit (HOSPITAL_COMMUNITY)
Admission: RE | Admit: 2013-04-09 | Discharge: 2013-04-09 | Disposition: A | Discharge status: Home / Self Care | Payer: Medicaid Other | Source: Ambulatory Visit | Attending: Nurse Practitioner | Admitting: Nurse Practitioner

## 2013-04-09 DIAGNOSIS — O358XX0 Maternal care for other (suspected) fetal abnormality and damage, not applicable or unspecified: Secondary | ICD-10-CM | POA: Insufficient documentation

## 2013-04-09 DIAGNOSIS — Z3689 Encounter for other specified antenatal screening: Secondary | ICD-10-CM

## 2013-04-09 DIAGNOSIS — O09529 Supervision of elderly multigravida, unspecified trimester: Secondary | ICD-10-CM | POA: Insufficient documentation

## 2013-04-09 DIAGNOSIS — Z1389 Encounter for screening for other disorder: Secondary | ICD-10-CM | POA: Insufficient documentation

## 2013-04-09 DIAGNOSIS — Z363 Encounter for antenatal screening for malformations: Secondary | ICD-10-CM | POA: Insufficient documentation

## 2013-04-13 ENCOUNTER — Ambulatory Visit (HOSPITAL_COMMUNITY): Payer: Managed Care, Other (non HMO)

## 2013-04-15 ENCOUNTER — Other Ambulatory Visit (HOSPITAL_COMMUNITY): Payer: Self-pay | Admitting: Nurse Practitioner

## 2013-04-15 DIAGNOSIS — Z3689 Encounter for other specified antenatal screening: Secondary | ICD-10-CM

## 2013-05-04 ENCOUNTER — Ambulatory Visit (HOSPITAL_COMMUNITY)
Admission: RE | Admit: 2013-05-04 | Discharge: 2013-05-04 | Disposition: A | Discharge status: Home / Self Care | Payer: No Typology Code available for payment source | Source: Ambulatory Visit | Attending: Nurse Practitioner | Admitting: Nurse Practitioner

## 2013-05-04 DIAGNOSIS — O09529 Supervision of elderly multigravida, unspecified trimester: Secondary | ICD-10-CM

## 2013-05-04 NOTE — ED Notes (Signed)
Appointment Date: 05/04/2013 DOB: Jun 29, 1975 Referring Provider: Tessa LernerLeath, Karla Wright, NP Attending:  Dr. Particia NearingMartha Decker  Linda [name] was seen for genetic counseling because of a maternal age of [age] y.o..  A Spanish interpreter was present and provided translation for the visit.  She was counseled regarding maternal age and the association with risk for chromosome conditions due to nondisjunction.   We reviewed chromosomes, nondisjunction, and the associated 1 in 1070 risk for fetal aneuploidy related to a maternal age of 38 y.o..  She was counseled that the risk for aneuploidy decreases as gestational age increases, accounting for those pregnancies which spontaneously abort.  We specifically discussed Down syndrome (trisomy 2221), trisomies 6813 and 2618, and sex chromosome aneuploidies (47,XXX and 47,XXY) including the common features and prognoses of each. We reviewed the results of her ultrasound which was performed a month ago.  She was counseled that 50-80% of babies with Down syndrome and up to 90% of babies with Trisomy 18 or 13, when the anatomy is well visualized, will have detectable anomalies or soft markers of aneuploidy.  As her ultrasound did not identify any fetal anomalies or soft markers of aneuploidy, her age related risks can be reduced.  We reviewed the option of noninvasive prenatal screening (NIPS)/cell free fetal DNA (cffDNA) testing.  She was counseled that this is used to modify a patient's a priori risk for aneuploidy, typically based on age. This estimate provides a pregnancy specific risk assessment. We reviewed the benefits and limitations of this option. Specifically, we discussed the conditions for which it screens, the detection rates, and false positive rates. She was also counseled regarding diagnostic testing by way of amniocentesis. We reviewed the approximate 1 in 300-500 risk for complications for amniocentesis, including spontaneous pregnancy loss.   She understands that  screening tests cannot rule out all birth defects or genetic syndromes. The patient was advised of this limitation and states she still does not want additional testing at this time.   Linda [name] was provided with written information regarding sickle cell anemia (SCA) including the carrier frequency and incidence in the Hispanic population, the availability of carrier testing and prenatal diagnosis if indicated.  In addition, we discussed that hemoglobinopathies are routinely screened for as part of the [place] newborn screening panel.  She declined hemoglobin electrophoresis today.  Both family histories were reviewed and found to be contributory.  Linda [name] stated that the father of this baby, Linda [name], is 39 years old.  We discussed  that advanced paternal age (APA) is defined as paternal age greater than or equal to age 39.  Recent large-scale sequencing studies have shown that approximately 80% of de novo point mutations are of paternal origin.  Many studies have demonstrated a strong correlation between increased paternal age and de novo point mutations.  Although no specific data is available regarding fetal risks for fathers 4045+ years old at conception, it is apparent that the overall risk for single gene conditions is increased.  It is estimated that the overall chance for a de novo mutation is ~0.5%.  She was counseled that genetic testing for each individual single gene condition is not warranted or available unless ultrasound or family history concerns lend suspicion to a specific condition.  We also discussed that newer literature suggests that the risk for autism spectrum disorders (ASD) may be increased in children born to fathers of APA.  We discussed that ASDs are among the most common neurodevelopmental disorders, with approximately 1 in 4785  children meeting criteria for ASD.  Approximately 80% of individuals diagnosed are female.  There is strong evidence that genetic factors play a critical role in  development of ASD.  While there have been recent advances in identifying specific genetic causes of ASD, there are still many individuals for whom the etiology of the ASD is not known.  At this time there is no reliable, comprehensive genetic testing available for ASD.     Linda [name] reported that her 53 year old son was born with a cleft palate, heart defect and has some learning issues.  She believes he may have Down syndrome. Linda [name] is not the father of that child.  Linda [name] reported that her two youngest children are unable to communicate with her, and do not have speech.  Linda [name] is the father of those children.  Linda [name] stated that her children have had evaluations and they do not know why they do not speak.  She reports that she is unaware of a diagnosis for them.  We discussed that if her children have autistic spectrum disorder as the cause of their lack of communication, the recurrence chance could be as high as 32%.    We discussed the chance that her children could have an inherited autosomal recessive condition.  If this was the explanation for their differences, there could be a 1 in 4 or 25% chance for a similar condition in this baby. We discussed that while an amniocentesis or cell free DNA testing would identify Down syndrome or some other chromosome conditions, if her son did not have Down syndrome, than we would not be reducing the chance for this baby to have a similar condition.  Additionally, if her two younger children have autism or a recessive condition, that would not necessarily be seen with cell free DNA or amniocentesis.  Thus, normal testing would not necessarily rule out the conditions for which she has concern. The remainder of the family history was noncontributory for birth defects, intellectual disability, and known genetic conditions. Without further information regarding the provided family history, an accurate genetic risk cannot be calculated. Further genetic counseling is  warranted if more information is obtained.  Linda [name] denied exposure to environmental toxins or chemical agents. She denied the use of alcohol, tobacco or street drugs. She denied significant viral illnesses during the course of her pregnancy. Her medical and surgical histories were noncontributory.   I counseled Linda [name], through an interpreter, regarding the above risks and available options.  The approximate face-to-face time with the genetic counselor was 55 minutes.  Mady Gemma, MS,  Certified Genetic Counselor

## 2013-07-08 ENCOUNTER — Other Ambulatory Visit (HOSPITAL_COMMUNITY): Payer: Self-pay | Admitting: Nurse Practitioner

## 2013-07-08 DIAGNOSIS — O09299 Supervision of pregnancy with other poor reproductive or obstetric history, unspecified trimester: Secondary | ICD-10-CM

## 2013-07-08 LAB — OB RESULTS CONSOLE GC/CHLAMYDIA
Chlamydia: NEGATIVE
Gonorrhea: NEGATIVE

## 2013-07-08 LAB — OB RESULTS CONSOLE GBS: GBS: NEGATIVE

## 2013-07-13 ENCOUNTER — Ambulatory Visit (HOSPITAL_COMMUNITY)
Admission: RE | Admit: 2013-07-13 | Discharge: 2013-07-13 | Disposition: A | Discharge status: Home / Self Care | Payer: Self-pay | Source: Ambulatory Visit | Attending: Nurse Practitioner | Admitting: Nurse Practitioner

## 2013-07-13 ENCOUNTER — Other Ambulatory Visit (HOSPITAL_COMMUNITY): Payer: Self-pay | Admitting: Nurse Practitioner

## 2013-07-13 DIAGNOSIS — Z3689 Encounter for other specified antenatal screening: Secondary | ICD-10-CM | POA: Insufficient documentation

## 2013-07-13 DIAGNOSIS — O288 Other abnormal findings on antenatal screening of mother: Secondary | ICD-10-CM

## 2013-07-13 DIAGNOSIS — O09299 Supervision of pregnancy with other poor reproductive or obstetric history, unspecified trimester: Secondary | ICD-10-CM

## 2013-07-13 DIAGNOSIS — O365990 Maternal care for other known or suspected poor fetal growth, unspecified trimester, not applicable or unspecified: Secondary | ICD-10-CM | POA: Insufficient documentation

## 2013-07-20 ENCOUNTER — Ambulatory Visit (HOSPITAL_COMMUNITY)
Admission: RE | Admit: 2013-07-20 | Discharge: 2013-07-20 | Disposition: A | Discharge status: Home / Self Care | Payer: Self-pay | Source: Ambulatory Visit | Attending: Nurse Practitioner | Admitting: Nurse Practitioner

## 2013-07-20 DIAGNOSIS — O288 Other abnormal findings on antenatal screening of mother: Secondary | ICD-10-CM

## 2013-07-20 DIAGNOSIS — O4190X0 Disorder of amniotic fluid and membranes, unspecified, unspecified trimester, not applicable or unspecified: Secondary | ICD-10-CM | POA: Insufficient documentation

## 2013-07-20 DIAGNOSIS — Z3689 Encounter for other specified antenatal screening: Secondary | ICD-10-CM | POA: Insufficient documentation

## 2013-08-01 ENCOUNTER — Inpatient Hospital Stay (HOSPITAL_COMMUNITY): Payer: No Typology Code available for payment source | Admitting: Anesthesiology

## 2013-08-01 ENCOUNTER — Encounter (HOSPITAL_COMMUNITY): Payer: No Typology Code available for payment source | Admitting: Anesthesiology

## 2013-08-01 ENCOUNTER — Inpatient Hospital Stay (HOSPITAL_COMMUNITY)
Admission: AD | Admit: 2013-08-01 | Discharge: 2013-08-02 | DRG: 775 | Disposition: A | Discharge status: Home / Self Care | Payer: No Typology Code available for payment source | Source: Ambulatory Visit | Attending: Obstetrics & Gynecology | Admitting: Obstetrics & Gynecology

## 2013-08-01 ENCOUNTER — Encounter (HOSPITAL_COMMUNITY): Payer: Self-pay | Admitting: *Deleted

## 2013-08-01 ENCOUNTER — Encounter (HOSPITAL_COMMUNITY)
Admission: AD | Disposition: A | Discharge status: Home / Self Care | Payer: Self-pay | Source: Ambulatory Visit | Attending: Obstetrics & Gynecology

## 2013-08-01 DIAGNOSIS — Z3490 Encounter for supervision of normal pregnancy, unspecified, unspecified trimester: Secondary | ICD-10-CM

## 2013-08-01 DIAGNOSIS — Z302 Encounter for sterilization: Secondary | ICD-10-CM

## 2013-08-01 DIAGNOSIS — O09529 Supervision of elderly multigravida, unspecified trimester: Principal | ICD-10-CM | POA: Diagnosis present

## 2013-08-01 DIAGNOSIS — IMO0001 Reserved for inherently not codable concepts without codable children: Secondary | ICD-10-CM

## 2013-08-01 DIAGNOSIS — O479 False labor, unspecified: Secondary | ICD-10-CM | POA: Diagnosis present

## 2013-08-01 HISTORY — DX: Other specified health status: Z78.9

## 2013-08-01 HISTORY — PX: TUBAL LIGATION: SHX77

## 2013-08-01 LAB — CBC
HEMATOCRIT: 35.6 % — AB (ref 36.0–46.0)
HEMOGLOBIN: 12.3 g/dL (ref 12.0–15.0)
MCH: 30.1 pg (ref 26.0–34.0)
MCHC: 34.6 g/dL (ref 30.0–36.0)
MCV: 87 fL (ref 78.0–100.0)
Platelets: 228 10*3/uL (ref 150–400)
RBC: 4.09 MIL/uL (ref 3.87–5.11)
RDW: 14.7 % (ref 11.5–15.5)
WBC: 8.8 10*3/uL (ref 4.0–10.5)

## 2013-08-01 LAB — RPR

## 2013-08-01 LAB — TYPE AND SCREEN
ABO/RH(D): O POS
ANTIBODY SCREEN: NEGATIVE

## 2013-08-01 LAB — ABO/RH: ABO/RH(D): O POS

## 2013-08-01 SURGERY — LIGATION, FALLOPIAN TUBE, POSTPARTUM
Anesthesia: Epidural | Site: Abdomen | Laterality: Bilateral

## 2013-08-01 MED ORDER — LIDOCAINE-EPINEPHRINE (PF) 2 %-1:200000 IJ SOLN
INTRAMUSCULAR | Status: DC | PRN
  Administered 2013-08-01: 3 mL via EPIDURAL
  Administered 2013-08-01 (×2): 5 mL via EPIDURAL
  Administered 2013-08-01: 2 mL via EPIDURAL

## 2013-08-01 MED ORDER — LIDOCAINE HCL (PF) 1 % IJ SOLN
30.0000 mL | INTRAMUSCULAR | Status: DC | PRN
  Filled 2013-08-01: qty 30

## 2013-08-01 MED ORDER — ZOLPIDEM TARTRATE 5 MG PO TABS: 5.0000 mg | ORAL_TABLET | Freq: Every evening | ORAL | Status: DC | PRN

## 2013-08-01 MED ORDER — MEPERIDINE HCL 25 MG/ML IJ SOLN: 6.2500 mg | INTRAMUSCULAR | Status: DC | PRN

## 2013-08-01 MED ORDER — DIPHENHYDRAMINE HCL 50 MG/ML IJ SOLN: 12.5000 mg | INTRAMUSCULAR | Status: DC | PRN

## 2013-08-01 MED ORDER — METOCLOPRAMIDE HCL 5 MG/ML IJ SOLN: 10.0000 mg | Freq: Once | INTRAMUSCULAR | Status: DC | PRN

## 2013-08-01 MED ORDER — FENTANYL 2.5 MCG/ML BUPIVACAINE 1/10 % EPIDURAL INFUSION (WH - ANES)
INTRAMUSCULAR | Status: DC | PRN
  Administered 2013-08-01: 12 mL/h via EPIDURAL

## 2013-08-01 MED ORDER — LACTATED RINGERS IV SOLN
INTRAVENOUS | Status: DC
  Administered 2013-08-01: 05:00:00 via INTRAVENOUS

## 2013-08-01 MED ORDER — PHENYLEPHRINE 40 MCG/ML (10ML) SYRINGE FOR IV PUSH (FOR BLOOD PRESSURE SUPPORT)
80.0000 ug | PREFILLED_SYRINGE | INTRAVENOUS | Status: DC | PRN
  Filled 2013-08-01: qty 2

## 2013-08-01 MED ORDER — BUPIVACAINE HCL (PF) 0.5 % IJ SOLN
INTRAMUSCULAR | Status: DC | PRN
  Administered 2013-08-01: 20 mL

## 2013-08-01 MED ORDER — LANOLIN HYDROUS EX OINT: TOPICAL_OINTMENT | CUTANEOUS | Status: DC | PRN

## 2013-08-01 MED ORDER — OXYCODONE-ACETAMINOPHEN 5-325 MG PO TABS
1.0000 | ORAL_TABLET | ORAL | Status: DC | PRN
  Administered 2013-08-01: 1 via ORAL
  Filled 2013-08-01: qty 1

## 2013-08-01 MED ORDER — SIMETHICONE 80 MG PO CHEW: 80.0000 mg | CHEWABLE_TABLET | ORAL | Status: DC | PRN

## 2013-08-01 MED ORDER — ONDANSETRON HCL 4 MG/2ML IJ SOLN
INTRAMUSCULAR | Status: DC | PRN
  Administered 2013-08-01: 4 mg via INTRAVENOUS

## 2013-08-01 MED ORDER — MIDAZOLAM HCL 2 MG/2ML IJ SOLN
INTRAMUSCULAR | Status: DC | PRN
  Administered 2013-08-01: 2 mg via INTRAVENOUS

## 2013-08-01 MED ORDER — WITCH HAZEL-GLYCERIN EX PADS: 1.0000 "application " | MEDICATED_PAD | CUTANEOUS | Status: DC | PRN

## 2013-08-01 MED ORDER — ONDANSETRON HCL 4 MG/2ML IJ SOLN: 4.0000 mg | Freq: Four times a day (QID) | INTRAMUSCULAR | Status: DC | PRN

## 2013-08-01 MED ORDER — FLEET ENEMA 7-19 GM/118ML RE ENEM
1.0000 | ENEMA | RECTAL | Status: DC | PRN
Start: 2013-08-01 — End: 2013-08-01

## 2013-08-01 MED ORDER — LACTATED RINGERS IV SOLN: 500.0000 mL | INTRAVENOUS | Status: DC | PRN

## 2013-08-01 MED ORDER — DIBUCAINE 1 % RE OINT: 1.0000 "application " | TOPICAL_OINTMENT | RECTAL | Status: DC | PRN

## 2013-08-01 MED ORDER — LIDOCAINE HCL (PF) 1 % IJ SOLN
INTRAMUSCULAR | Status: DC | PRN
  Administered 2013-08-01: 4 mL
  Administered 2013-08-01: 3 mL

## 2013-08-01 MED ORDER — FENTANYL 2.5 MCG/ML BUPIVACAINE 1/10 % EPIDURAL INFUSION (WH - ANES)
14.0000 mL/h | INTRAMUSCULAR | Status: DC | PRN
  Filled 2013-08-01: qty 125

## 2013-08-01 MED ORDER — IBUPROFEN 600 MG PO TABS
600.0000 mg | ORAL_TABLET | Freq: Four times a day (QID) | ORAL | Status: DC | PRN
Start: 2013-08-01 — End: 2013-08-01

## 2013-08-01 MED ORDER — IBUPROFEN 600 MG PO TABS
600.0000 mg | ORAL_TABLET | Freq: Four times a day (QID) | ORAL | Status: DC
  Administered 2013-08-01 – 2013-08-02 (×4): 600 mg via ORAL
  Filled 2013-08-01 (×4): qty 1

## 2013-08-01 MED ORDER — FENTANYL CITRATE 0.05 MG/ML IJ SOLN
25.0000 ug | INTRAMUSCULAR | Status: DC | PRN
  Administered 2013-08-01: 25 ug via INTRAVENOUS

## 2013-08-01 MED ORDER — OXYTOCIN BOLUS FROM INFUSION
500.0000 mL | INTRAVENOUS | Status: DC
  Administered 2013-08-01: 500 mL via INTRAVENOUS

## 2013-08-01 MED ORDER — SENNOSIDES-DOCUSATE SODIUM 8.6-50 MG PO TABS
2.0000 | ORAL_TABLET | ORAL | Status: DC
  Administered 2013-08-02: 2 via ORAL
  Filled 2013-08-01: qty 2

## 2013-08-01 MED ORDER — ONDANSETRON HCL 4 MG/2ML IJ SOLN
INTRAMUSCULAR | Status: AC
  Filled 2013-08-01: qty 2

## 2013-08-01 MED ORDER — OXYTOCIN 40 UNITS IN LACTATED RINGERS INFUSION - SIMPLE MED
62.5000 mL/h | INTRAVENOUS | Status: DC
  Filled 2013-08-01: qty 1000

## 2013-08-01 MED ORDER — ONDANSETRON HCL 4 MG/2ML IJ SOLN: 4.0000 mg | INTRAMUSCULAR | Status: DC | PRN

## 2013-08-01 MED ORDER — ONDANSETRON HCL 4 MG PO TABS: 4.0000 mg | ORAL_TABLET | ORAL | Status: DC | PRN

## 2013-08-01 MED ORDER — MIDAZOLAM HCL 2 MG/2ML IJ SOLN
INTRAMUSCULAR | Status: AC
  Filled 2013-08-01: qty 2

## 2013-08-01 MED ORDER — LACTATED RINGERS IV SOLN: 500.0000 mL | Freq: Once | INTRAVENOUS | Status: DC

## 2013-08-01 MED ORDER — MEASLES, MUMPS & RUBELLA VAC ~~LOC~~ INJ
0.5000 mL | INJECTION | Freq: Once | SUBCUTANEOUS | Status: DC
  Filled 2013-08-01: qty 0.5

## 2013-08-01 MED ORDER — BUPIVACAINE HCL (PF) 0.5 % IJ SOLN
INTRAMUSCULAR | Status: AC
  Filled 2013-08-01: qty 30

## 2013-08-01 MED ORDER — EPHEDRINE 5 MG/ML INJ
10.0000 mg | INTRAVENOUS | Status: DC | PRN
  Filled 2013-08-01: qty 2

## 2013-08-01 MED ORDER — BENZOCAINE-MENTHOL 20-0.5 % EX AERO: 1.0000 "application " | INHALATION_SPRAY | CUTANEOUS | Status: DC | PRN

## 2013-08-01 MED ORDER — PRENATAL MULTIVITAMIN CH
1.0000 | ORAL_TABLET | Freq: Every day | ORAL | Status: DC
  Administered 2013-08-01: 1 via ORAL
  Filled 2013-08-01: qty 1

## 2013-08-01 MED ORDER — CITRIC ACID-SODIUM CITRATE 334-500 MG/5ML PO SOLN
30.0000 mL | ORAL | Status: DC | PRN
Start: 2013-08-01 — End: 2013-08-01
  Administered 2013-08-01: 30 mL via ORAL
  Filled 2013-08-01: qty 15

## 2013-08-01 MED ORDER — FENTANYL 2.5 MCG/ML BUPIVACAINE 1/10 % EPIDURAL INFUSION (WH - ANES): 14.0000 mL/h | INTRAMUSCULAR | Status: DC | PRN

## 2013-08-01 MED ORDER — OXYCODONE-ACETAMINOPHEN 5-325 MG PO TABS: 1.0000 | ORAL_TABLET | ORAL | Status: DC | PRN

## 2013-08-01 MED ORDER — SODIUM BICARBONATE 8.4 % IV SOLN
INTRAVENOUS | Status: AC
  Filled 2013-08-01: qty 50

## 2013-08-01 MED ORDER — EPHEDRINE 5 MG/ML INJ
10.0000 mg | INTRAVENOUS | Status: DC | PRN
  Filled 2013-08-01: qty 4
  Filled 2013-08-01: qty 2

## 2013-08-01 MED ORDER — TETANUS-DIPHTH-ACELL PERTUSSIS 5-2.5-18.5 LF-MCG/0.5 IM SUSP: 0.5000 mL | Freq: Once | INTRAMUSCULAR | Status: DC

## 2013-08-01 MED ORDER — MISOPROSTOL 200 MCG PO TABS
800.0000 ug | ORAL_TABLET | Freq: Once | ORAL | Status: AC
  Administered 2013-08-01: 800 ug via RECTAL

## 2013-08-01 MED ORDER — LIDOCAINE-EPINEPHRINE (PF) 2 %-1:200000 IJ SOLN
INTRAMUSCULAR | Status: AC
  Filled 2013-08-01: qty 20

## 2013-08-01 MED ORDER — DIPHENHYDRAMINE HCL 25 MG PO CAPS
25.0000 mg | ORAL_CAPSULE | Freq: Four times a day (QID) | ORAL | Status: DC | PRN
Start: 2013-08-01 — End: 2013-08-02

## 2013-08-01 MED ORDER — ACETAMINOPHEN 325 MG PO TABS: 650.0000 mg | ORAL_TABLET | ORAL | Status: DC | PRN

## 2013-08-01 MED ORDER — FENTANYL CITRATE 0.05 MG/ML IJ SOLN
INTRAMUSCULAR | Status: AC
  Administered 2013-08-01: 25 ug via INTRAVENOUS
  Filled 2013-08-01: qty 2

## 2013-08-01 MED ORDER — PHENYLEPHRINE 40 MCG/ML (10ML) SYRINGE FOR IV PUSH (FOR BLOOD PRESSURE SUPPORT)
80.0000 ug | PREFILLED_SYRINGE | INTRAVENOUS | Status: DC | PRN
  Filled 2013-08-01: qty 2
  Filled 2013-08-01: qty 10

## 2013-08-01 MED ORDER — MISOPROSTOL 200 MCG PO TABS
ORAL_TABLET | ORAL | Status: AC
  Filled 2013-08-01: qty 4

## 2013-08-01 SURGICAL SUPPLY — 22 items
BENZOIN TINCTURE PRP APPL 2/3 (GAUZE/BANDAGES/DRESSINGS) ×3 IMPLANT
CATH FOLEY LATEX FREE 14FR (CATHETERS) ×2
CATH FOLEY LF 14FR (CATHETERS) ×1 IMPLANT
CATH ROBINSON RED A/P 16FR (CATHETERS) IMPLANT
CHLORAPREP W/TINT 26ML (MISCELLANEOUS) ×3 IMPLANT
CLIP FILSHIE TUBAL LIGA STRL (Clip) ×3 IMPLANT
CLOSURE WOUND 1/4X4 (GAUZE/BANDAGES/DRESSINGS) ×1
CLOTH BEACON ORANGE TIMEOUT ST (SAFETY) ×3 IMPLANT
DRSG COVADERM PLUS 2X2 (GAUZE/BANDAGES/DRESSINGS) ×3 IMPLANT
GLOVE ECLIPSE 7.0 STRL STRAW (GLOVE) ×3 IMPLANT
GLOVE INDICATOR 7.0 STRL GRN (GLOVE) ×6 IMPLANT
GOWN STRL REUS W/TWL LRG LVL3 (GOWN DISPOSABLE) ×6 IMPLANT
NEEDLE HYPO 22GX1.5 SAFETY (NEEDLE) IMPLANT
NS IRRIG 1000ML POUR BTL (IV SOLUTION) ×3 IMPLANT
PACK ABDOMINAL MINOR (CUSTOM PROCEDURE TRAY) ×3 IMPLANT
STRIP CLOSURE SKIN 1/4X4 (GAUZE/BANDAGES/DRESSINGS) ×2 IMPLANT
SUT VIC AB 0 CT1 27 (SUTURE) ×2
SUT VIC AB 0 CT1 27XBRD ANBCTR (SUTURE) ×1 IMPLANT
SUT VIC AB 4-0 PS2 27 (SUTURE) ×3 IMPLANT
SYR CONTROL 10ML LL (SYRINGE) IMPLANT
TOWEL OR 17X24 6PK STRL BLUE (TOWEL DISPOSABLE) ×6 IMPLANT
WATER STERILE IRR 1000ML POUR (IV SOLUTION) IMPLANT

## 2013-08-01 NOTE — Anesthesia Postprocedure Evaluation (Signed)
  Anesthesia Post-op Note  Patient: Linda [name]  Procedure(s) Performed: Procedure(s): POST PARTUM TUBAL LIGATION (Bilateral)  Patient Location: Mother/Baby  Anesthesia Type:Epidural  Level of Consciousness: awake and alert   Airway and Oxygen Therapy: Patient Spontanous Breathing  Post-op Pain: mild  Post-op Assessment: Post-op Vital signs reviewed, No signs of Nausea or vomiting, Pain level controlled, No headache, No residual numbness and No residual motor weakness  Post-op Vital Signs: Reviewed  Last Vitals:  Filed Vitals:   08/01/13 1629  BP: 94/60  Pulse: 61  Temp: 37 C  Resp: 18    Complications: No apparent anesthesia complications
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
## 2013-08-01 NOTE — Anesthesia Preprocedure Evaluation (Addendum)

## 2013-08-01 NOTE — Brief Op Note (Signed)
08/01/2013  9:48 AM  PATIENT:  Linda [name]  38 y.o. female  PRE-OPERATIVE DIAGNOSIS:  Desires Sterilization  POST-OPERATIVE DIAGNOSIS:  Desires Sterilization  PROCEDURE:  Procedure(s): POST PARTUM TUBAL LIGATION (Bilateral)  SURGEON:  Surgeon(s) and Role:    * Willodean Rosenthal, MD - Primary  ANESTHESIA:   epidural  EBL:  Total I/O In: -  Out: 300 [Blood:300]  BLOOD ADMINISTERED:none  DRAINS: none   LOCAL MEDICATIONS USED:  MARCAINE     SPECIMEN:  No Specimen  DISPOSITION OF SPECIMEN:  N/A  COUNTS:  YES  TOURNIQUET:  * No tourniquets in log *  DICTATION: .Note written in EPIC  PLAN OF CARE: Patinet is already an inpatient  PATIENT DISPOSITION:  PACU - hemodynamically stable.   Delay start of Pharmacological VTE agent (>24hrs) due to surgical blood loss or risk of bleeding: yes
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
## 2013-08-01 NOTE — Transfer of Care (Signed)
Immediate Anesthesia Transfer of Care Note  Patient: Linda [name]  Procedure(s) Performed: Procedure(s): POST PARTUM TUBAL LIGATION (Bilateral)  Patient Location: PACU  Anesthesia Type:Epidural  Level of Consciousness: awake and alert   Airway & Oxygen Therapy: Patient Spontanous Breathing  Post-op Assessment: Report given to PACU RN and Post -op Vital signs reviewed and stable  Post vital signs: Reviewed and stable  Complications: No apparent anesthesia complications

## 2013-08-01 NOTE — Op Note (Signed)
08/01/2013  9:48 AM  PATIENT:  Linda [name]  38 y.o. female  PRE-OPERATIVE DIAGNOSIS:  Desires Sterilization  POST-OPERATIVE DIAGNOSIS:  Desires Sterilization  PROCEDURE:  Procedure(s): POST PARTUM TUBAL LIGATION (Bilateral)  SURGEON:  Surgeon(s) and Role:    * Willodean Rosenthal, MD - Primary  ANESTHESIA:   epidural  EBL:  Total I/O In: -  Out: 300 [Blood:300]  BLOOD ADMINISTERED:none  DRAINS: none   LOCAL MEDICATIONS USED:  MARCAINE     SPECIMEN:  No Specimen  DISPOSITION OF SPECIMEN:  N/A  COUNTS:  YES  TOURNIQUET:  * No tourniquets in log *  DICTATION: .Note written in EPIC  PLAN OF CARE: Patinet is already an inpatient  PATIENT DISPOSITION:  PACU - hemodynamically stable.   Delay start of Pharmacological VTE agent (>24hrs) due to surgical blood loss or risk of bleeding: yes  INDICATIONS: 38 y.o. [id number]  with undesired fertility,status post vaginal delivery, desires permanent sterilization.  Other reversible forms of contraception were discussed with patient; she declines all other modalities. Risks of procedure discussed with patient including but not limited to: risk of regret, permanence of method, bleeding, infection, injury to surrounding organs and need for additional procedures.  Failure risk of 0.5-1% with increased risk of ectopic gestation if pregnancy occurs was also discussed with patient.  (a Spanish interpreter was used for the discussion)   FINDINGS:  Normal uterus, tubes, and ovaries.  PROCEDURE DETAILS: The patient was taken to the operating room where her epidural anesthesia was dosed up to surgical level and found to be adequate.  She was then placed in the dorsal supine position and prepped and draped in sterile fashion.  After an adequate timeout was performed, attention was turned to the patient's abdomen where a small transverse skin incision was made under the umbilical fold. The incision was taken down to the layer of fascia using the  scalpel, and fascia was incised, and extended bilaterally using Mayo scissors. The peritoneum was entered in a sharp fashion. Attention was then turned to the patient's uterus, and right fallopian tube was identified and followed out to the fimbriated end.  A Filshie clip was placed on the right fallopian tube about 3 cm from the cornual attachment, with care given to incorporate the underlying mesosalpinx.  A similar process was carried out on the left side allowing for bilateral tubal sterilization.  Good hemostasis was noted overall.  The instruments were then removed from the patient's abdomen and the fascial incision was repaired with 0 Vicryl, and the skin was closed with a 4-0 Vicryl subcuticular stitch. The patient tolerated the procedure well.  Instrument, sponge, and needle counts were correct times two.  The patient was then taken to the recovery room awake and in stable condition. 20cc of 0.5% Maricaine was injected into the incision post procedure. There were no immediate complications noted.

## 2013-08-01 NOTE — Anesthesia Preprocedure Evaluation (Signed)
Anesthesia Evaluation  Patient identified by MRN, date of birth, ID band Patient awake    Reviewed: Allergy & Precautions, H&P , NPO status , Patient's Chart, lab work & pertinent test results  Airway Mallampati: II TM Distance: >3 FB Neck ROM: full    Dental  (+) Teeth Intact   Pulmonary neg pulmonary ROS,  breath sounds clear to auscultation  Pulmonary exam normal       Cardiovascular negative cardio ROS  Rhythm:regular Rate:Normal     Neuro/Psych negative neurological ROS  negative psych ROS   GI/Hepatic negative GI ROS, Neg liver ROS,   Endo/Other  negative endocrine ROS  Renal/GU negative Renal ROS  negative genitourinary   Musculoskeletal negative musculoskeletal ROS (+)   Abdominal   Peds  Hematology negative hematology ROS (+)   Anesthesia Other Findings       Reproductive/Obstetrics negative OB ROS Desires sterilization                           Anesthesia Physical  Anesthesia Plan  ASA: II  Anesthesia Plan: Epidural   Post-op Pain Management:    Induction:   Airway Management Planned: Natural Airway  Additional Equipment:   Intra-op Plan:   Post-operative Plan:   Informed Consent: I have reviewed the patients History and Physical, chart, labs and discussed the procedure including the risks, benefits and alternatives for the proposed anesthesia with the patient or authorized representative who has indicated his/her understanding and acceptance.   Dental Advisory Given  Plan Discussed with: Anesthesiologist and Surgeon  Anesthesia Plan Comments: (Labs checked- platelets confirmed with RN in room. Fetal heart tracing, per RN, reported to be stable enough for sitting procedure. Discussed epidural, and patient consents to the procedure:  included risk of possible headache,backache, failed block, allergic reaction, and nerve injury. This patient was asked if she had  any questions or concerns before the procedure started.  Will use existing epidural for PPTL.)        Anesthesia Quick Evaluation

## 2013-08-01 NOTE — MAU Note (Signed)
Pt stated she has been contracting for 3 days got stronger around 1 am. denies leaking or bleeding good fetal movement reproted

## 2013-08-01 NOTE — Anesthesia Postprocedure Evaluation (Signed)
  Anesthesia Post-op Note  Patient: Linda [name]  Procedure(s) Performed: Procedure(s): POST PARTUM TUBAL LIGATION (Bilateral)  Patient Location: PACU  Anesthesia Type:Epidural  Level of Consciousness: awake, alert  and oriented  Airway and Oxygen Therapy: Patient Spontanous Breathing  Post-op Pain: mild  Post-op Assessment: Post-op Vital signs reviewed, Patient's Cardiovascular Status Stable, Respiratory Function Stable, Patent Airway, No signs of Nausea or vomiting, Pain level controlled, No headache and No backache  Post-op Vital Signs: Reviewed and stable  Last Vitals:  Filed Vitals:   08/01/13 0845  BP: 99/58  Pulse: 101  Temp:   Resp:     Complications: No apparent anesthesia complications

## 2013-08-01 NOTE — Anesthesia Postprocedure Evaluation (Signed)
  Anesthesia Post-op Note  Patient: Linda [name]  Procedure(s) Performed: * No procedures listed *  Patient Location: PACU  Anesthesia Type:Epidural  Level of Consciousness: awake, alert  and oriented  Airway and Oxygen Therapy: Patient Spontanous Breathing  Post-op Pain: none  Post-op Assessment: Post-op Vital signs reviewed, Patient's Cardiovascular Status Stable, Respiratory Function Stable, Patent Airway, No signs of Nausea or vomiting, Pain level controlled, No headache, No backache, No residual numbness and No residual motor weakness  Post-op Vital Signs: Reviewed and stable  Last Vitals:  Filed Vitals:   08/01/13 0800  BP: 143/71  Pulse: 133  Temp: 37.1 C  Resp:     Complications: No apparent anesthesia complications

## 2013-08-01 NOTE — Addendum Note (Signed)
Addendum created 08/01/13 1643 by Angela Adam, CRNA   Modules edited: Notes Section   Notes Section:  File: [id number]

## 2013-08-01 NOTE — H&P (Signed)
Attestation of Attending Supervision of Obstetric Fellow: Evaluation and management procedures were performed by the Obstetric Fellow under my supervision and collaboration.  I have reviewed the Obstetric Fellow's note and chart, and I agree with the management and plan.  Amario Longmore, MD, FACOG Attending Obstetrician & Gynecologist Faculty Practice, [facility]   

## 2013-08-01 NOTE — Anesthesia Postprocedure Evaluation (Signed)
  Anesthesia Post-op Note  Patient: Linda [name]  Procedure(s) Performed: * No procedures listed *  Patient Location: Mother/Baby  Anesthesia Type:Epidural  Level of Consciousness: awake and alert   Airway and Oxygen Therapy: Patient Spontanous Breathing  Post-op Pain: mild  Post-op Assessment: Post-op Vital signs reviewed, No signs of Nausea or vomiting, Pain level controlled, No headache, No residual numbness and No residual motor weakness  Post-op Vital Signs: Reviewed  Last Vitals:  Filed Vitals:   08/01/13 1629  BP: 94/60  Pulse: 61  Temp: 37 C  Resp: 18    Complications: No apparent anesthesia complications

## 2013-08-01 NOTE — Lactation Note (Signed)
This note was copied from the chart of Linda [name]. Lactation Consultation Note  Patient Name: Linda Linda [name] [id number] Date: 08/01/2013 Reason for consult: Initial assessment Baby 12 hours of life. With the assistance of in-house interpreter "Linda [name]" discussed mom's plan for feeding her baby. Mom states that she has fed 4 of her other children this way. Enc mom to call out for assistance as needed and to see a latch the next time she offers the breast. Mom given [facility] brochure and is aware of OP/BFSG and community resources.  Maternal Data Has patient been taught Hand Expression?: Yes Does the patient have breastfeeding experience prior to this delivery?: Yes  Feeding Feeding Type:  (Mom states that she is giving bottles and offering breasts. Mom has done this with each of her children except one with "open lip.")  LATCH Score/Interventions Latch:  (Enc mom to call out for assistance with a latch to make sure baby is latching well.)                    Lactation Tools Discussed/Used     Consult Status Consult Status: Follow-up Follow-up type: In-patient    Linda [name] 08/01/2013, 7:44 PM

## 2013-08-02 ENCOUNTER — Encounter (HOSPITAL_COMMUNITY): Payer: Self-pay | Admitting: Obstetrics & Gynecology

## 2013-08-02 MED ORDER — OXYCODONE-ACETAMINOPHEN 5-325 MG PO TABS: 1.0000 | ORAL_TABLET | ORAL | Status: DC | PRN

## 2013-08-02 MED ORDER — IBUPROFEN 600 MG PO TABS: 600.0000 mg | ORAL_TABLET | Freq: Four times a day (QID) | ORAL | Status: DC

## 2013-08-02 NOTE — Progress Notes (Signed)
Discharge teaching done with [facility], interpreter.  FOB at bedside.  Patient stated she had no questions about her care or the baby's care.  Only question was about Medicaid and patient was given number to call Burman Foster, Financial Counselor,Tuesday morning.

## 2013-08-02 NOTE — Discharge Instructions (Signed)
Ligadura de trompas con laparoscopa - Cuidados posteriores  (Laparoscopic Tubal Ligation, Care After) Siga estas instrucciones durante las prximas semanas. Estas indicaciones le proporcionan informacin general acerca de cmo deber cuidarse despus del procedimiento. El mdico tambin podr darle instrucciones ms especficas. El tratamiento ha sido planificado segn las prcticas mdicas actuales, pero en algunos casos pueden ocurrir problemas. Comunquese con el mdico si tiene algn problema o tiene preguntas despus del procedimiento.  INSTRUCCIONES PARA EL CUIDADO EN EL HOGAR   Haga reposo por el resto del da.  Slo tome medicamentos de venta libre o recetados para Primary school teacher, las molestias o bajar la fiebre segn las indicaciones de su mdico. No tome aspirina. Puede ocasionar hemorragias.  Podr reanudar gradualmente sus actividades diarias, la dieta, el descanso, conducir vehculos y Printmaker.  Evite las actividades extenuantes durante 2 semanas, o segn lo que le hayan indicado.  No conduzca automviles luego de tomar analgsicos.  No levante objetos que pesen ms de 5 libras ( 2,5 kg) durante 2 semanas, o segn las indicaciones.  Solo tome duchas, evite los baos, hasta que el mdico vuelva a verla.  Cambie el vendaje tal como se le indic.  Tmese la [employer] veces por da y Engineering geologist.  Trate de conseguir ayuda para las tareas [employer] durante 7 a -[address].  Concurra a una visita con el mdico para que le retiren los puntos (suturas) y para Aeronautical engineer. SOLICITE ATENCIN MDICA SI:   Presenta enrojecimiento, hinchazn o aumento del dolor en la herida.  Hay una secrecin en la herida que dura ms de Civil engineer, contracting.  El dolor Chance.  Tiene una erupcin.  Se siente mareada o sufre un desmayo.  Tiene algn problema o sufre alguna reaccin a los medicamentos.  Necesita un medicamento ms fuerte o un cambio de Microbiologist.  Advierte un olor ftido que  proviene de la herida o del vendaje.  La herida se abre luego de que le han extrado las suturas.  Est constipado. SOLICITE ATENCIN MDICA DE INMEDIATO SI:   Se desmaya.  Tiene fiebre.  Siente cada vez ms dolor abdominal.  Siente dolor intenso en los hombros.  Tiene un sangrado o drenaje en la sutura, o que proviene de la vagina (canal del parto) luego de la [place].  Le falta el aire o tiene dificultad para respirar.  Siente dolor en el pecho o en las piernas.  Tiene nuseas o vmitos persistentes. ASEGRESE DE QUE:   Comprende estas instrucciones.  Controlar su enfermedad.  Solicitar ayuda de inmediato si no mejora o si empeora. Document Released: 08/27/2011 [facility] Patient Information 2014 [place], [place].

## 2013-08-02 NOTE — Discharge Summary (Signed)
Obstetric Discharge Summary Reason for Admission: onset of labor Prenatal Procedures: none Intrapartum Procedures: spontaneous vaginal delivery @ 39 4/7 weeks Postpartum Procedures: P.P. tubal ligation Complications-Operative and Postpartum: none Hemoglobin  Date Value Ref Range Status  08/01/2013 12.3  12.0 - 15.0 g/dL Final     HCT  Date Value Ref Range Status  08/01/2013 35.6* 36.0 - 46.0 % Final  Pt s/p SVD.  S/p PPBTL on PPD#0.   She had an uncomplicated PP course. She is breastfeeding.  Physical Exam:  General: alert and no distress Lochia: appropriate Uterine Fundus: firm Incision: healing well DVT Evaluation: No evidence of DVT seen on physical exam.  Discharge Diagnoses: Term Pregnancy-delivered. Sterilization.  Discharge Information: Date: 08/02/2013 Activity: pelvic rest Diet: routine Medications: Ibuprofen and Percocet Condition: stable Instructions: refer to practice specific booklet Discharge to: home Follow-up Information   Follow up with [facility] [place] In 4 weeks.   Contact information:   [address] [place] [address] [phone]      Newborn Data: Live born female  Birth Weight: 7 lb 10.2 oz (3465 g) APGAR: 8, 9  Home with mother.  Linda [name] 08/02/2013, 8:44 AM

## 2013-08-02 NOTE — Discharge Summary (Signed)
Obstetric Discharge Summary Reason for Admission: onset of labor Prenatal Procedures: none Intrapartum Procedures: spontaneous vaginal delivery Postpartum Procedures: P.P. tubal ligation Complications-Operative and Postpartum: none Hemoglobin  Date Value Ref Range Status  08/01/2013 12.3  12.0 - 15.0 g/dL Final     HCT  Date Value Ref Range Status  08/01/2013 35.6* 36.0 - 46.0 % Final    Discharge Diagnoses: Term Pregnancy-delivered  Hospital Course:  Linda [name] is a 38 y.o. [id number] who presented with onset of labor.  She had a uncomplicated SVD with postpartum tubal ligation. She was able to ambulate, tolerate PO and void normally. She was discharged home with instructions for postpartum care.    Delivery Note  At 7:38 AM a viable female was delivered via Vaginal, Spontaneous Delivery (Presentation: ; Occiput Anterior). APGAR: 8, 9; weight .  Placenta status: Intact, Spontaneous. Cord: 3 vessels with the following complications: None.  Anesthesia: Epidural  Episiotomy: None  Lacerations: None  Suture Repair: na  Est. Blood Loss (mL): 300  Mom to postpartum. Baby to Couplet care / Skin to Skin.  Pt pushed with good maternal effort to deliver a liveborn female via NSVD with spontaneous cry. Baby placed on maternal abdomen. Delayed cord clamping performed. Cord cut by myself. Placenta delivered intact with 3V cord via traction and pitocin. no tears. No complications. Mom and baby to postpartum.   Keli L Beck  08/01/2013, 8:50 AM  Physical Exam:  General: alert, cooperative and no distress Lochia: appropriate Uterine Fundus: firm DVT Evaluation: No evidence of DVT seen on physical exam. Negative Homan's sign. No cords or calf tenderness. No significant calf/ankle edema.  Discharge Information: Date: 08/02/2013 Activity: pelvic rest Diet: routine Medications: motrin and prenatal vitamins Baby feeding: plans to breastfeed, plans to bottle feed Contraception: bilateral tubal  ligation Condition: stable Instructions: refer to practice specific booklet Discharge to: home   Newborn Data: Live born female  Birth Weight: 7 lb 10.2 oz (3465 g) APGAR: 8, 9  Home with mother.   Will prescribe percocet for pain control  Pt educated on proper wound care. Will take steri strips off in a few days once they began to peel off on their own   Lawernce Pitts PA-S2 08/02/2013, 8:29 AM

## 2013-08-03 NOTE — Discharge Summary (Signed)
Attestation of Attending Supervision of Advanced Practitioner (CNM/NP): Evaluation and management procedures were performed by the Advanced Practitioner under my supervision and collaboration.  I have reviewed the Advanced Practitioner's note and chart, and I agree with the management and plan.  Blondina Coderre Harraway-Smith 9:29 AM

## 2014-01-10 ENCOUNTER — Encounter (HOSPITAL_COMMUNITY): Payer: Self-pay | Admitting: Obstetrics & Gynecology

## 2014-06-21 ENCOUNTER — Emergency Department (HOSPITAL_COMMUNITY)
Admission: EM | Admit: 2014-06-21 | Discharge: 2014-06-22 | Disposition: A | Discharge status: Home / Self Care | Payer: BLUE CROSS/BLUE SHIELD | Attending: Emergency Medicine | Admitting: Emergency Medicine

## 2014-06-21 ENCOUNTER — Encounter (HOSPITAL_COMMUNITY): Payer: Self-pay | Admitting: Emergency Medicine

## 2014-06-21 DIAGNOSIS — Z9851 Tubal ligation status: Secondary | ICD-10-CM | POA: Insufficient documentation

## 2014-06-21 DIAGNOSIS — R197 Diarrhea, unspecified: Secondary | ICD-10-CM | POA: Insufficient documentation

## 2014-06-21 DIAGNOSIS — R1011 Right upper quadrant pain: Secondary | ICD-10-CM

## 2014-06-21 DIAGNOSIS — R1084 Generalized abdominal pain: Secondary | ICD-10-CM | POA: Insufficient documentation

## 2014-06-21 DIAGNOSIS — R109 Unspecified abdominal pain: Secondary | ICD-10-CM | POA: Diagnosis present

## 2014-06-21 DIAGNOSIS — R1110 Vomiting, unspecified: Secondary | ICD-10-CM | POA: Insufficient documentation

## 2014-06-21 NOTE — ED Notes (Signed)
Patient c/o abd pain, started @1600 , states she vomited 5 times since then. Denies nausea now.

## 2014-06-21 NOTE — ED Notes (Signed)
Pt presents from home via EMS with c/o abdominal pain, nausea, and vomiting. Pt reports she has thrown up once today, is still nauseated at this time. Pt reported to EMS that her abdominal pain started about one hour after she ate around 5 pm tonight. Pt has had 6 episodes of diarrhea since eating.

## 2014-06-21 NOTE — ED Notes (Signed)
Bed: [id number] Expected date:  Expected time:  Means of arrival:  Comments: Tr2

## 2014-06-22 ENCOUNTER — Emergency Department (HOSPITAL_COMMUNITY): Payer: BLUE CROSS/BLUE SHIELD

## 2014-06-22 LAB — COMPREHENSIVE METABOLIC PANEL
ALBUMIN: 4.5 g/dL (ref 3.5–5.2)
ALK PHOS: 68 U/L (ref 39–117)
ALT: 15 U/L (ref 0–35)
ANION GAP: 6 (ref 5–15)
AST: 19 U/L (ref 0–37)
BILIRUBIN TOTAL: 0.8 mg/dL (ref 0.3–1.2)
BUN: 9 mg/dL (ref 6–23)
CHLORIDE: 106 mmol/L (ref 96–112)
CO2: 28 mmol/L (ref 19–32)
Calcium: 8.7 mg/dL (ref 8.4–10.5)
Creatinine, Ser: 0.64 mg/dL (ref 0.50–1.10)
GFR calc Af Amer: >90 mL/min (ref >90)
Glucose, Bld: 109 mg/dL — ABNORMAL HIGH (ref 70–99)
POTASSIUM: 3.9 mmol/L (ref 3.5–5.1)
Sodium: 140 mmol/L (ref 135–145)
Total Protein: 7.4 g/dL (ref 6.0–8.3)

## 2014-06-22 LAB — CBC WITH DIFFERENTIAL/PLATELET
BASOS PCT: 0 % (ref 0–1)
Basophils Absolute: 0 10*3/uL (ref 0.0–0.1)
EOS ABS: 0.1 10*3/uL (ref 0.0–0.7)
EOS PCT: 1 % (ref 0–5)
HCT: 41.3 % (ref 36.0–46.0)
Hemoglobin: 13.8 g/dL (ref 12.0–15.0)
Lymphocytes Relative: 13 % (ref 12–46)
Lymphs Abs: 1.4 10*3/uL (ref 0.7–4.0)
MCH: 30.1 pg (ref 26.0–34.0)
MCHC: 33.4 g/dL (ref 30.0–36.0)
MCV: 90 fL (ref 78.0–100.0)
MONO ABS: 0.5 10*3/uL (ref 0.1–1.0)
Monocytes Relative: 5 % (ref 3–12)
Neutro Abs: 9 10*3/uL — ABNORMAL HIGH (ref 1.7–7.7)
Neutrophils Relative %: 81 % — ABNORMAL HIGH (ref 43–77)
PLATELETS: 248 10*3/uL (ref 150–400)
RBC: 4.59 MIL/uL (ref 3.87–5.11)
RDW: 13.4 % (ref 11.5–15.5)
WBC: 11 10*3/uL — ABNORMAL HIGH (ref 4.0–10.5)

## 2014-06-22 LAB — LIPASE, BLOOD: LIPASE: 26 U/L (ref 11–59)

## 2014-06-22 LAB — URINALYSIS, ROUTINE W REFLEX MICROSCOPIC
Bilirubin Urine: NEGATIVE
GLUCOSE, UA: NEGATIVE mg/dL
Hgb urine dipstick: NEGATIVE
Ketones, ur: NEGATIVE mg/dL
LEUKOCYTES UA: NEGATIVE
NITRITE: NEGATIVE
PH: 8 (ref 5.0–8.0)
Protein, ur: NEGATIVE mg/dL
SPECIFIC GRAVITY, URINE: 1.012 (ref 1.005–1.030)
UROBILINOGEN UA: 0.2 mg/dL (ref 0.0–1.0)

## 2014-06-22 MED ORDER — ONDANSETRON HCL 4 MG/2ML IJ SOLN
INTRAMUSCULAR | Status: AC
  Filled 2014-06-22: qty 2

## 2014-06-22 MED ORDER — DICYCLOMINE HCL 20 MG PO TABS: 20.0000 mg | ORAL_TABLET | Freq: Four times a day (QID) | ORAL | Status: DC | PRN

## 2014-06-22 MED ORDER — FAMOTIDINE 20 MG PO TABS: 20.0000 mg | ORAL_TABLET | Freq: Two times a day (BID) | ORAL | Status: DC

## 2014-06-22 MED ORDER — ONDANSETRON 8 MG PO TBDP: 8.0000 mg | ORAL_TABLET | Freq: Three times a day (TID) | ORAL | Status: DC | PRN

## 2014-06-22 MED ORDER — FENTANYL CITRATE 0.05 MG/ML IJ SOLN
INTRAMUSCULAR | Status: AC
  Filled 2014-06-22: qty 2

## 2014-06-22 MED ORDER — DICYCLOMINE HCL 10 MG/ML IM SOLN
INTRAMUSCULAR | Status: AC
  Filled 2014-06-22: qty 2

## 2014-06-22 NOTE — ED Provider Notes (Signed)
CSN: [id number]     Arrival date & time 06/21/14  2221 History   First MD Initiated Contact with Patient 06/21/14 2358     Chief Complaint  Patient presents with  . Abdominal Pain  . Emesis  . Diarrhea     (Consider location/radiation/quality/duration/timing/severity/associated sxs/prior Treatment) HPI 39 year old female presents to the emergency department from home via EMS with complaint of abdominal pain, nausea and vomiting.  Patient reports that she has been having ongoing abdominal pain since having a tubal ligation done a year ago.  Patient reports pain is sometimes worsened with eating.  She denies any fever or chills.  She does not normally have nausea, vomiting or diarrhea.  This issue with her bowel pain.  No unusual foods, no sick contacts.  No other surgeries.  She is not sought care for this pain up until this point, as she was told that her insurance would not cover an ED visit.  She does not have a primary care doctor. Past Medical History  Diagnosis Date  . Medical history non-contributory    Past Surgical History  Procedure Laterality Date  . No past surgeries    . Tubal ligation Bilateral 08/01/2013    Procedure: POST PARTUM TUBAL LIGATION;  Surgeon: Willodean Rosenthalarolyn Harraway-Smith, MD;  Location: [facility];  Service: Gynecology;  Laterality: Bilateral;   History reviewed. No pertinent family history. History  Substance Use Topics  . Smoking status: Never Smoker   . Smokeless tobacco: Never Used  . Alcohol Use: No   OB History    Gravida Para Term Preterm AB TAB SAB Ectopic Multiple Living   7 6 6  1  1   6      Review of Systems   See History of Present Illness; otherwise all other systems are reviewed and negative  Allergies  Review of patient's allergies indicates no known allergies.  Home Medications   Prior to Admission medications   Medication Sig Start Date End Date Taking? Authorizing Provider  bismuth subsalicylate (PEPTO BISMOL) 262 MG/15ML suspension  Take 30 mLs by mouth every 6 (six) hours as needed for indigestion.   Yes Historical Provider, MD  dicyclomine (BENTYL) 20 MG tablet Take 1 tablet (20 mg total) by mouth every 6 (six) hours as needed for spasms (for abdominal cramping). 06/22/14   Marisa Severinlga Bomani Oommen, MD  famotidine (PEPCID) 20 MG tablet Take 1 tablet (20 mg total) by mouth 2 (two) times daily. 06/22/14   Marisa Severinlga Vasil Juhasz, MD  ibuprofen (ADVIL,MOTRIN) 600 MG tablet Take 1 tablet (600 mg total) by mouth every 6 (six) hours. Patient not taking: Reported on 06/21/2014 08/02/13   Willodean Rosenthalarolyn Harraway-Smith, MD  ondansetron (ZOFRAN ODT) 8 MG disintegrating tablet Take 1 tablet (8 mg total) by mouth every 8 (eight) hours as needed for nausea or vomiting. 06/22/14   Marisa Severinlga Akaila Rambo, MD  oxyCODONE-acetaminophen (PERCOCET/ROXICET) 5-325 MG per tablet Take 1-2 tablets by mouth every 4 (four) hours as needed for severe pain (moderate - severe pain). Patient not taking: Reported on 06/21/2014 08/02/13   Misty StanleyLisa A Leftwich-Kirby, CNM   BP 92/51 mmHg  Pulse 60  Temp(Src) 98.2 F (36.8 C) (Oral)  Resp 18  SpO2 99% Physical Exam  Constitutional: She is oriented to person, place, and time. She appears well-developed and well-nourished.  HENT:  Head: Normocephalic and atraumatic.  Nose: Nose normal.  Mouth/Throat: Oropharynx is clear and moist.  Eyes: Conjunctivae and EOM are normal. Pupils are equal, round, and reactive to light.  Neck: Normal range of  motion. Neck supple. No JVD present. No tracheal deviation present. No thyromegaly present.  Cardiovascular: Normal rate, regular rhythm, normal heart sounds and intact distal pulses.  Exam reveals no gallop and no friction rub.   No murmur heard. Pulmonary/Chest: Effort normal and breath sounds normal. No stridor. No respiratory distress. She has no wheezes. She has no rales. She exhibits no tenderness.  Abdominal: Soft. Bowel sounds are normal. She exhibits no distension and no mass. There is tenderness (patient has diffuse  tenderness across the abdomen, slightly worse in epigastrium and upper quadrant). There is no rebound and no guarding.  Musculoskeletal: Normal range of motion. She exhibits no edema or tenderness.  Lymphadenopathy:    She has no cervical adenopathy.  Neurological: She is alert and oriented to person, place, and time. She displays normal reflexes. She exhibits normal muscle tone. Coordination normal.  Skin: Skin is warm and dry. No rash noted. No erythema. No pallor.  Psychiatric: She has a normal mood and affect. Her behavior is normal. Judgment and thought content normal.  Nursing note and vitals reviewed.   ED Course  Procedures (including critical care time) Labs Review Labs Reviewed  CBC WITH DIFFERENTIAL/PLATELET - Abnormal; Notable for the following:    WBC 11.0 (*)    Neutrophils Relative % 81 (*)    Neutro Abs 9.0 (*)    All other components within normal limits  COMPREHENSIVE METABOLIC PANEL - Abnormal; Notable for the following:    Glucose, Bld 109 (*)    All other components within normal limits  LIPASE, BLOOD  URINALYSIS, ROUTINE W REFLEX MICROSCOPIC  URINALYSIS, ROUTINE W REFLEX MICROSCOPIC  I-STAT TROPOININ, ED  POC URINE PREG, ED    Imaging Review US Abdomen Limited Ruq  06/22/2014   CLINICAL DATA:  Right upper quadrant pain after eating.  EXAM: US ABDOMEN LIMITED - RIGHT UPPER QUADRANT  COMPARISON:  None.  FINDINGS: Gallbladder:  No gallstones or wall thickening visualized. Shadowing behind the gallbladder is consistent with bowel gas. No sonographic Murphy sign noted.  Common bile duct:  Diameter: 4 mm  Liver:  No focal lesion identified. Within normal limits in parenchymal echogenicity.  IMPRESSION: Negative right upper quadrant ultrasound.   Electronically Signed   By: Marnee Spring M.D.   On: 06/22/2014 04:01     EKG Interpretation   Date/Time:  Tuesday June 21 2014 23:19:49 EDT Ventricular Rate:  70 PR Interval:  149 QRS Duration: 94 QT Interval:   389 QTC Calculation: 420 R Axis:   88 Text Interpretation:  Sinus rhythm ST elev, probable normal early repol  pattern Confirmed by Todd Jelinski  MD, Mariel Gaudin ([id number]) on 06/22/2014 3:19:55 AM      MDM   Final diagnoses:  Generalized abdominal pain    39 year old female, acute on chronic abdominal pain with new nausea, vomiting and diarrhea.  Right upper quadrant ultrasound without signs of cholelithiasis).  Patient feeling better after medications.  We'll refer her to health and wellness center for further evaluation.  Patient to be discharged with Zofran, Bentyl and Pepcid.    Marisa Severin, MD 06/22/14 [id number]

## 2014-06-22 NOTE — Discharge Instructions (Signed)
Su diagnstico diferencial de hoy no ha mostrado una causa especfica para sus sntomas. Tome los medicamentos segn las indicaciones. Haga un seguimiento con un mdico de atencin primaria para su posterior anlisis y tratamiento de los sntomas.   Dolor abdominal (Abdominal Pain) El dolor puede tener muchas causas. Normalmente la causa del dolor abdominal no es una enfermedad y Scientist, clinical (histocompatibility and immunogenetics)mejorar sin TEFL teachertratamiento. Frecuentemente puede controlarse y tratarse en casa. Su mdico le Medical sales representativerealizar un examen fsico y posiblemente solicite anlisis de sangre y radiografas para ayudar a Chief Strategy Officerdeterminar la gravedad de su dolor. Sin embargo, en IAC/InterActiveCorpmuchos casos, debe transcurrir ms tiempo antes de que se pueda Clinical research associateencontrar una causa evidente del dolor. Antes de llegar a ese punto, es posible que su mdico no sepa si necesita ms pruebas o un tratamiento ms profundo. INSTRUCCIONES PARA EL CUIDADO EN EL HOGAR  Est atento al dolor para ver si hay cambios. Las siguientes indicaciones ayudarn a Architectural technologistaliviar cualquier molestia que pueda sentir:  Mount Carmelome solo medicamentos de venta libre o recetados, segn las indicaciones del mdico.  No tome laxantes a menos que se lo haya indicado su mdico.  Pruebe con Neomia Dearuna dieta lquida absoluta (caldo, t o agua) segn se lo indique su mdico. Introduzca gradualmente una dieta normal, segn su tolerancia. SOLICITE ATENCIN MDICA SI:  Tiene dolor abdominal sin explicacin.  Tiene dolor abdominal relacionado con nuseas o diarrea.  Tiene dolor cuando orina o defeca.  Experimenta dolor abdominal que lo despierta de noche.  Tiene dolor abdominal que empeora o mejora cuando come alimentos.  Tiene dolor abdominal que empeora cuando come alimentos grasosos.  Tiene fiebre. SOLICITE ATENCIN MDICA DE INMEDIATO SI:   El dolor no desaparece en un plazo mximo de 2horas.  No deja de (vomitar).  El Engineer, miningdolor se siente solo en partes del abdomen, como el lado derecho o la parte inferior izquierda del  abdomen.  Evaca materia fecal sanguinolenta o negra, de aspecto alquitranado. ASEGRESE DE QUE:  Comprende estas instrucciones.  Controlar su afeccin.  Recibir ayuda de inmediato si no mejora o si empeora. Document Released: 02/25/2005 Document Revised: 03/02/2013 [facility] Patient Information 2015 [place], [place]. This information is not intended to replace advice given to you by your health care provider. Make sure you discuss any questions you have with your health care provider.  Dolor de Chief Executive Officeretiologa desconocida (Dolor sin causa conocida) (Pain of Unknown Etiology [Pain Without a Known Cause]) Usted ha concurrido a Youth workerla consulta con un profesional a causa de Herbalistun dolor. El dolor puede ocurrir en cualquier parte del cuerpo. Generalmente no tiene CIT Groupuna causa definida. Si las pruebas de laboratorio (sangre y [place]), las radiografas u otros estudios son normales, el profesional que lo asiste podr tratarlo sin Geologist, engineeringconocer la causa del Engineer, miningdolor. Un ejemplo es la cefalea. La mayor parte de los dolores de Turkmenistancabeza se diagnostican a travs de los antecedentes. Esto significa que el profesional que lo asiste le har preguntas con respecto a la cefalea. El profesional determina un tratamiento basndose en sus respuestas. Generalmente las ArvinMeritorpruebas que se realizan debido a las cefaleas son normales. Con frecuencia no se realizan exmenes, a menos que no haya respuesta a los medicamentos. En el da de [place] le administrarn medicamentos para que se sienta mejor, sin considerar la ubicacin del Engineer, miningdolor. Si no puede hallarse una causa fsica para Chief Technology Officerel dolor, as como lleg en forma repentina, en la mayor parte de los Pharmacologistcasos desaparecer gradualmente.  Si siente dolor y no hay motivos para sentirlo, es importante que realice un  seguimiento con el profesional que lo asiste. Si el dolor empeora, o no se va, podr ser necesario repetir las pruebas y buscar ms exhaustivamente las posibles causas.  Utilice los medicamentos de venta  libre o de prescripcin para Chief Technology Officer, Environmental health practitioner o la [place], segn se lo indique el profesional que lo asiste.  Para proteger su privacidad, no se entregarn los [employer] pruebas por telfono. Asegrese de conseguirlos. Consulte el modo en que podr obtenerlos si no se lo han informado. Es su responsabilidad contar con los [employer].  Podr seguir con todas las actividades a menos que stas le ocasionen ms [employer]. Cuando el dolor [place], es importante que reanude gradualmente todas las actividades. Retorne a las actividades o los ejercicios comenzando lentamente e incrementando gradualmente la intensidad y la duracin Durante los perodos de dolor intenso, el reposo en cama puede ser beneficioso. Recustese o sintese en la posicin que le sea ms cmoda.  El hielo es muy eficaz en los episodios agudos (repentinos). Use una bolsa plstica grande llena de hielo y envuelta en una toalla. Esto Engineer, materials.  Consulte al profesional que le asiste si [employer] Financial. l podr ayudarlo o derivarlo para realizar ejercicios o fisioterapia, si fuera necesario. Si le han administrado medicamentos, no conduzca, no opere maquinarias ni Diplomatic Services operational officer, y tampoco firme documentos legales por 24 horas. No beba alcohol, no tome pldoras para dormir ni otros medicamentos que puedan interferir con Scientist, research (medical). Consulte inmediatamente con el profesionalque lo asiste si siente que el dolor empeora y no se alivia con los medicamentos. Document Released: 12/05/2004 Document Revised: 05/20/2011 [facility] Patient Information 2015 [place], [place]. This information is not intended to replace advice given to you by your health care provider. Make sure you discuss any questions you have with your health care provider.

## 2014-11-30 ENCOUNTER — Ambulatory Visit: Payer: No Typology Code available for payment source | Attending: Family Medicine

## 2015-01-23 ENCOUNTER — Encounter (HOSPITAL_BASED_OUTPATIENT_CLINIC_OR_DEPARTMENT_OTHER): Payer: No Typology Code available for payment source | Admitting: Clinical

## 2015-01-23 ENCOUNTER — Ambulatory Visit: Payer: Self-pay | Attending: Family Medicine | Admitting: Family Medicine

## 2015-01-23 ENCOUNTER — Encounter: Payer: Self-pay | Admitting: Family Medicine

## 2015-01-23 VITALS — BP 99/61 | HR 60 | Temp 98.3°F | Resp 16 | Ht <= 58 in | Wt 117.0 lb

## 2015-01-23 DIAGNOSIS — M4124 Other idiopathic scoliosis, thoracic region: Secondary | ICD-10-CM | POA: Insufficient documentation

## 2015-01-23 DIAGNOSIS — F329 Major depressive disorder, single episode, unspecified: Secondary | ICD-10-CM | POA: Insufficient documentation

## 2015-01-23 DIAGNOSIS — Z659 Problem related to unspecified psychosocial circumstances: Secondary | ICD-10-CM | POA: Insufficient documentation

## 2015-01-23 DIAGNOSIS — F418 Other specified anxiety disorders: Secondary | ICD-10-CM

## 2015-01-23 DIAGNOSIS — K219 Gastro-esophageal reflux disease without esophagitis: Secondary | ICD-10-CM | POA: Insufficient documentation

## 2015-01-23 DIAGNOSIS — E559 Vitamin D deficiency, unspecified: Secondary | ICD-10-CM | POA: Insufficient documentation

## 2015-01-23 DIAGNOSIS — M419 Scoliosis, unspecified: Secondary | ICD-10-CM

## 2015-01-23 DIAGNOSIS — F419 Anxiety disorder, unspecified: Principal | ICD-10-CM

## 2015-01-23 LAB — TSH: TSH: 0.812 u[IU]/mL (ref 0.350–4.500)

## 2015-01-23 MED ORDER — DULOXETINE HCL 20 MG PO CPEP: 20.0000 mg | ORAL_CAPSULE | Freq: Every day | ORAL | Status: DC

## 2015-01-23 MED ORDER — FAMOTIDINE 20 MG PO TABS: 20.0000 mg | ORAL_TABLET | Freq: Two times a day (BID) | ORAL | Status: AC

## 2015-01-23 NOTE — Patient Instructions (Signed)
Linda [name] was seen today for establish care, abdominal pain and anxiety.  Diagnoses and all orders for this visit:  Anxiety and depression -     TSH -     Vitamin D, 25-hydroxy -     DULoxetine (CYMBALTA) 20 MG capsule; Take 1 capsule (20 mg total) by mouth daily.  Gastroesophageal reflux disease, esophagitis presence not specified -     famotidine (PEPCID) 20 MG tablet; Take 1 tablet (20 mg total) by mouth 2 (two) times daily.  Other social stressor  Thoracic scoliosis   F/u in 4 weeks for abdominal pain and stress  Dr. Armen PickupFunches

## 2015-01-23 NOTE — Progress Notes (Signed)
Patient ID: Linda [name], female   DOB: July 03, 1975, 39 y.o.   MRN: [id number]   Subjective:  Patient ID: Linda [name], female    DOB: Mar 21, 1975  Age: 39 y.o. MRN: [id number] Spanish interpreter used  CC: Establish Care; Abdominal Pain; and Anxiety   HPI Linda [name] presents for abdominal pain:  1. Abdominal pain: constant periumbilical pain for one year. Severe in 06/2014 and she went to ED. It has not been as severe since. Daily. 2/10. Pressure. Sometimes better or worse with eating. Less painful after bowel movement. Normal stools. No nausea, emesis, fever. Chills at times. Associated with pain in back in neck. Associated with headaches at times, eye twitching and feeling of nervousness.   2. Stress: patient is a single mom. Children age 47-18. Has poor sleep. Has stress, depressed mood.   Past Medical History  Diagnosis Date  . Medical history non-contributory     Past Surgical History  Procedure Laterality Date  . No past surgeries    . Tubal ligation Bilateral 08/01/2013    Procedure: POST PARTUM TUBAL LIGATION;  Surgeon: Willodean Rosenthal, MD;  Location: [facility];  Service: Gynecology;  Laterality: Bilateral;    Family History  Problem Relation Age of Onset  . Heart disease Sister     Social History  Substance Use Topics  . Smoking status: Never Smoker   . Smokeless tobacco: Never Used  . Alcohol Use: No    ROS Review of Systems  Constitutional: Negative for fever and chills.  Eyes: Negative for visual disturbance.  Respiratory: Negative for shortness of breath.   Cardiovascular: Positive for chest pain.  Gastrointestinal: Positive for abdominal pain. Negative for blood in stool.  Musculoskeletal: Positive for myalgias, back pain and neck pain. Negative for arthralgias.  Skin: Negative for rash.  Allergic/Immunologic: Negative for immunocompromised state.  Hematological: Negative for adenopathy. Does not bruise/bleed easily.  Psychiatric/Behavioral: Positive  for sleep disturbance. Negative for suicidal ideas and dysphoric mood. The patient is nervous/anxious.     Objective:   Today's Vitals: BP 99/61 mmHg  Pulse 60  Temp(Src) 98.3 F (36.8 C) (Oral)  Resp 16  Ht 4' 8.5" (1.435 m)  Wt 117 lb (53.071 kg)  BMI 25.77 kg/m2  SpO2 98%  LMP 01/10/2015 Wt Readings from Last 3 Encounters:  01/23/15 117 lb (53.071 kg)  08/01/13 120 lb (54.432 kg)    Physical Exam  Constitutional: She is oriented to person, place, and time. She appears well-developed and well-nourished. No distress.  HENT:  Head: Normocephalic and atraumatic.  Cardiovascular: Normal rate, regular rhythm, normal heart sounds and intact distal pulses.   Pulmonary/Chest: Effort normal and breath sounds normal.  Musculoskeletal: She exhibits no edema.       Thoracic back: She exhibits deformity.  Thoracic scoliosis with R side elevated compared to left.   Neurological: She is alert and oriented to person, place, and time.  Skin: Skin is warm and dry. No rash noted.  Psychiatric: She has a normal mood and affect.   Depression screen PHQ 2/9 01/23/2015  Decreased Interest 3  Down, Depressed, Hopeless 2  PHQ - 2 Score 5  Altered sleeping 1  Tired, decreased energy 3  Change in appetite 2  Feeling bad or failure about yourself  0  Trouble concentrating 3  Moving slowly or fidgety/restless 1  Suicidal thoughts 0  PHQ-9 Score 15    GAD 7 : Generalized Anxiety Score 01/23/2015  Nervous, Anxious, on Edge 3  Control/stop worrying  2  Worry too much - different things 3  Trouble relaxing 3  Restless 1  Easily annoyed or irritable 2  Afraid - awful might happen 2  Total GAD 7 Score 16    Assessment & Plan:   Anxiety and depression A: anxiety and depression in setting of stressors P: Start cymbalta Internal referral to social work   GERD (gastroesophageal reflux disease) A: exam consistent with GERD P: refilled pepcid   Thoracic scoliosis A; thoracic scoliosis  with upper back pain P: cymbalta     Outpatient Encounter Prescriptions as of 01/23/2015  Medication Sig  . bismuth subsalicylate (PEPTO BISMOL) 262 MG/15ML suspension Take 30 mLs by mouth every 6 (six) hours as needed for indigestion.  . dicyclomine (BENTYL) 20 MG tablet Take 1 tablet (20 mg total) by mouth every 6 (six) hours as needed for spasms (for abdominal cramping). (Patient not taking: Reported on 01/23/2015)  . famotidine (PEPCID) 20 MG tablet Take 1 tablet (20 mg total) by mouth 2 (two) times daily. (Patient not taking: Reported on 01/23/2015)  . ibuprofen (ADVIL,MOTRIN) 600 MG tablet Take 1 tablet (600 mg total) by mouth every 6 (six) hours. (Patient not taking: Reported on 06/21/2014)  . ondansetron (ZOFRAN ODT) 8 MG disintegrating tablet Take 1 tablet (8 mg total) by mouth every 8 (eight) hours as needed for nausea or vomiting. (Patient not taking: Reported on 01/23/2015)  . oxyCODONE-acetaminophen (PERCOCET/ROXICET) 5-325 MG per tablet Take 1-2 tablets by mouth every 4 (four) hours as needed for severe pain (moderate - severe pain). (Patient not taking: Reported on 06/21/2014)   No facility-administered encounter medications on file as of 01/23/2015.    Follow-up: No Follow-up on file.    Dessa PhiJosalyn Iran Kievit MD

## 2015-01-23 NOTE — Progress Notes (Signed)
Establish care Upper abdominal pain and burning  Anxiety mood swings No pain today  No suicide thought in the past week

## 2015-01-23 NOTE — Assessment & Plan Note (Signed)
A: exam consistent with GERD P: refilled pepcid

## 2015-01-23 NOTE — Progress Notes (Signed)
ASSESSMENT: Pt currently experiencing symptoms of anxiety and depression. [facility]; would benefit from psychoeducation and supportive counseling regarding coping with symptoms of anxiety and depression.  Stage of Change: contemplative  PLAN: 1. F/U with behavioral health consultant in one month 2. Psychiatric Medications: Cymbalta (start today). 3. Behavioral recommendation(s):   -Consider daily relaxation breathing, as practiced in office -Consider reading educational materials regarding coping with symptoms of anxiety and depression -[facility] Spanish-speaking [facility], as needed SUBJECTIVE: Pt. referred by Dr Armen PickupFunches for symptoms of anxiety and depression:  Pt. reports the following symptoms/concerns: Pt states that life has been difficult for her since she was abandoned by childrens' father when she was expecting 40th child (under 2, range from 1-18), she "tries to relax" to cope. Family does not have any other needs (food, shelter, etc.), and she is working.  Duration of problem: less than two years Severity: moderate  OBJECTIVE: Orientation & Cognition: Oriented x3. Thought processes normal and appropriate to situation. Mood: appropriate. Affect: appropriate Appearance: appropriate Risk of harm to self or others: no known risk of harm to self or others Substance use: none Assessments administered: PHQ9: 15/ GAD7: 16  Diagnosis: Anxiety and depression CPT Code: F41.8 -------------------------------------------- Other(s) present in the room: 1yo daughter, teenage son, video interpreter  Time spent with patient in exam room: 30 minutes

## 2015-01-23 NOTE — Assessment & Plan Note (Signed)
A: anxiety and depression in setting of stressors P: Start cymbalta Internal referral to social work

## 2015-01-23 NOTE — Assessment & Plan Note (Signed)
A; thoracic scoliosis with upper back pain P: cymbalta

## 2015-01-24 DIAGNOSIS — E559 Vitamin D deficiency, unspecified: Secondary | ICD-10-CM | POA: Insufficient documentation

## 2015-01-24 LAB — VITAMIN D 25 HYDROXY (VIT D DEFICIENCY, FRACTURES): VIT D 25 HYDROXY: 15 ng/mL — AB (ref 30–100)

## 2015-01-24 MED ORDER — VITAMIN D (ERGOCALCIFEROL) 1.25 MG (50000 UNIT) PO CAPS: 50000.0000 [IU] | ORAL_CAPSULE | ORAL | Status: AC

## 2015-01-24 NOTE — Addendum Note (Signed)
Addended by: Dessa PhiFUNCHES, Janeal Abadi on: 01/24/2015 09:13 AM   Modules accepted: Orders

## 2015-01-25 ENCOUNTER — Telehealth: Payer: Self-pay | Admitting: *Deleted

## 2015-01-25 NOTE — Telephone Encounter (Signed)
-----   Message from Dessa PhiJosalyn Funches, MD sent at 01/24/2015  9:08 AM EST ----- Vit D deficiency Normal TSH Treat with vit D supplement

## 2015-01-25 NOTE — Telephone Encounter (Signed)
LVM to return call.

## 2015-01-26 NOTE — Telephone Encounter (Signed)
Patient returned nurse phone call. °Please follow up. °

## 2015-01-31 NOTE — Telephone Encounter (Signed)
Date of birth verified by pt Lab results given Low Vit D, TSH [facility]  Take one pill once per wk for 8 wk

## 2015-01-31 NOTE — Telephone Encounter (Signed)
Patient returned nurse phone call.

## 2015-01-31 NOTE — Telephone Encounter (Signed)
Left massage with female to return call

## 2015-02-21 ENCOUNTER — Encounter: Payer: Self-pay | Admitting: Family Medicine

## 2015-02-21 ENCOUNTER — Ambulatory Visit: Payer: Self-pay | Attending: Family Medicine | Admitting: Family Medicine

## 2015-02-21 VITALS — BP 101/67 | HR 59 | Temp 98.1°F | Resp 16 | Ht <= 58 in | Wt 116.0 lb

## 2015-02-21 DIAGNOSIS — F419 Anxiety disorder, unspecified: Secondary | ICD-10-CM | POA: Insufficient documentation

## 2015-02-21 DIAGNOSIS — F329 Major depressive disorder, single episode, unspecified: Secondary | ICD-10-CM | POA: Insufficient documentation

## 2015-02-21 DIAGNOSIS — R109 Unspecified abdominal pain: Secondary | ICD-10-CM | POA: Insufficient documentation

## 2015-02-21 DIAGNOSIS — K0889 Other specified disorders of teeth and supporting structures: Secondary | ICD-10-CM | POA: Insufficient documentation

## 2015-02-21 DIAGNOSIS — F418 Other specified anxiety disorders: Secondary | ICD-10-CM | POA: Insufficient documentation

## 2015-02-21 DIAGNOSIS — E559 Vitamin D deficiency, unspecified: Secondary | ICD-10-CM | POA: Insufficient documentation

## 2015-02-21 DIAGNOSIS — Z0000 Encounter for general adult medical examination without abnormal findings: Secondary | ICD-10-CM | POA: Insufficient documentation

## 2015-02-21 MED ORDER — DULOXETINE HCL 30 MG PO CPEP: 30.0000 mg | ORAL_CAPSULE | Freq: Every day | ORAL | Status: AC

## 2015-02-21 NOTE — Patient Instructions (Addendum)
Linda [name] was seen today for follow-up and anxiety.  Diagnoses and all orders for this visit:  Pain, dental -     Ambulatory referral to Dentistry  Anxiety and depression -     DULoxetine (CYMBALTA) 30 MG capsule; Take 1 capsule (30 mg total) by mouth daily.   F/u in 6-8 weeks for pap smear  Dr. Armen PickupFunches

## 2015-02-21 NOTE — Progress Notes (Signed)
F/U Anxiety, dental referral  Anxiety medication working, sleeping better and no mood changes Stated Pepcid making her sick, dizziness and HA Stated taking Pepcid taking once per day (was prescribed BID) No tobacco user  No suicidal thought in the past two week

## 2015-02-21 NOTE — Assessment & Plan Note (Signed)
Improved with cymbalta. Still with fatigue.  Increase cymbalta to 30 mg daily

## 2015-02-21 NOTE — Progress Notes (Signed)
Patient ID: Linda [name], female   DOB: 11-08-1975, 39 y.o.   MRN: [id number] .chwe Patient ID: Linda [name], female   DOB: 10-Aug-1975, 39 y.o.   MRN: [id number]   Subjective:  Patient ID: Linda [name], female    DOB: Sep 15, 1975  Age: 39 y.o. MRN: [id number] Spanish interpreter used  CC: Follow-up and Anxiety   HPI Linda [name] presents for abdominal pain:  1. Stress: improved with cymbalta. She still complains of low energy. She was found to have vit D deficiency. She is now taking vitamin D. Patient is a single mom. Children age 66-18. Has poor sleep. Has stress, depressed mood.   2. Dental pain: crown on R upper molar has broken off. Has pain. No swelling or fever. Requesting referral to dentist.   Past Medical History  Diagnosis Date  . Medical history non-contributory     Past Surgical History  Procedure Laterality Date  . No past surgeries    . Tubal ligation Bilateral 08/01/2013    Procedure: POST PARTUM TUBAL LIGATION;  Surgeon: Willodean Rosenthal, MD;  Location: [facility];  Service: Gynecology;  Laterality: Bilateral;    Family History  Problem Relation Age of Onset  . Heart disease Sister     Social History  Substance Use Topics  . Smoking status: Never Smoker   . Smokeless tobacco: Never Used  . Alcohol Use: No    ROS Review of Systems  Constitutional: Positive for fatigue. Negative for fever and chills.  HENT: Positive for dental problem.   Eyes: Negative for visual disturbance.  Respiratory: Negative for shortness of breath.   Cardiovascular: Negative for chest pain.  Gastrointestinal: Negative for abdominal pain and blood in stool.  Musculoskeletal: Negative for myalgias, back pain, arthralgias and neck pain.  Skin: Negative for rash.  Allergic/Immunologic: Negative for immunocompromised state.  Hematological: Negative for adenopathy. Does not bruise/bleed easily.  Psychiatric/Behavioral: Positive for sleep disturbance. Negative for suicidal ideas and  dysphoric mood. The patient is nervous/anxious.     Objective:   Today's Vitals: BP 101/67 mmHg  Pulse 59  Temp(Src) 98.1 F (36.7 C) (Oral)  Resp 16  Ht  (1.346 m)  Wt 116 lb (52.617 kg)  BMI 29.04 kg/m2  SpO2 100%  LMP 02/17/2015 Wt Readings from Last 3 Encounters:  02/21/15 116 lb (52.617 kg)  01/23/15 117 lb (53.071 kg)  08/01/13 120 lb (54.432 kg)    Physical Exam  Constitutional: She is oriented to person, place, and time. She appears well-developed and well-nourished. No distress.  HENT:  Head: Normocephalic and atraumatic.  Mouth/Throat: No oral lesions. Abnormal dentition. Dental caries present. No dental abscesses.  Cardiovascular: Normal rate, regular rhythm, normal heart sounds and intact distal pulses.   Pulmonary/Chest: Effort normal and breath sounds normal.  Musculoskeletal: She exhibits no edema.       Thoracic back: She exhibits no deformity.  Neurological: She is alert and oriented to person, place, and time.  Skin: Skin is warm and dry. No rash noted.  Psychiatric: She has a normal mood and affect.   Depression screen [facility] 2/9 02/21/2015 01/23/2015  Decreased Interest 3 3  Down, Depressed, Hopeless 0 2  PHQ - 2 Score 3 5  Altered sleeping 0 1  Tired, decreased energy 3 3  Change in appetite 1 2  Feeling bad or failure about yourself  0 0  Trouble concentrating 0 3  Moving slowly or fidgety/restless 0 1  Suicidal thoughts 0 0  PHQ-9 Score  7 15    GAD 7 : Generalized Anxiety Score 02/21/2015 01/23/2015  Nervous, Anxious, on Edge 1 3  Control/stop worrying 1 2  Worry too much - different things 3 3  Trouble relaxing 0 3  Restless 0 1  Easily annoyed or irritable 0 2  Afraid - awful might happen 1 2  Total GAD 7 Score 6 16    Assessment & Plan:   Anxiety and depression Improved with cymbalta. Still with fatigue.  Increase cymbalta to 30 mg daily     Outpatient Encounter Prescriptions as of 02/21/2015  Medication Sig  .  DULoxetine (CYMBALTA) 30 MG capsule Take 1 capsule (30 mg total) by mouth daily.  . famotidine (PEPCID) 20 MG tablet Take 1 tablet (20 mg total) by mouth 2 (two) times daily.  . Vitamin D, Ergocalciferol, (DRISDOL) 50000 UNITS CAPS capsule Take 1 capsule (50,000 Units total) by mouth every 7 (seven) days. For 8 weeks  . [DISCONTINUED] DULoxetine (CYMBALTA) 20 MG capsule Take 1 capsule (20 mg total) by mouth daily.   No facility-administered encounter medications on file as of 02/21/2015.    Follow-up: Return in about 6 weeks (around 04/04/2015) for pap .    Dessa PhiJosalyn Corby Vandenberghe MD

## 2015-03-28 ENCOUNTER — Telehealth: Payer: Self-pay | Admitting: Family Medicine

## 2015-03-28 NOTE — Telephone Encounter (Signed)
Patient came in requesting a referral for a Dentist. Patient was seen last month seeking a dental referral on 12/13. Please follow up with patient.

## 2015-03-28 NOTE — Telephone Encounter (Signed)
Referral was placed last month at [facility] See referral tab, and update patient with details

## 2015-03-29 NOTE — Telephone Encounter (Signed)
LVM to return call.

## 2015-03-29 NOTE — Telephone Encounter (Signed)
Patient returned nurse phone call.

## 2015-03-30 NOTE — Telephone Encounter (Signed)
LVM referral placed, please return call for more information      Sent Urgent Referral to [facility] ph. # [phone] [address] [place], [place] [address]  They will contact the patient to schedule an appointment I don't know how long it will take

## 2015-04-19 IMAGING — US US OB FOLLOW-UP
1 series · 12 of 28 positions shown · non-contrast
Comparison: none

[Series 1: us ob follow up · 41 acquisitions, 12 frames shown]
[im 2/41]
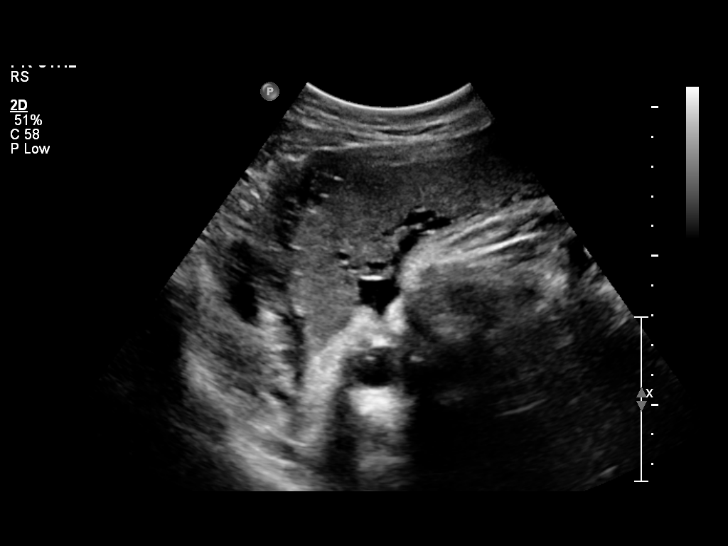
[im 5/41]
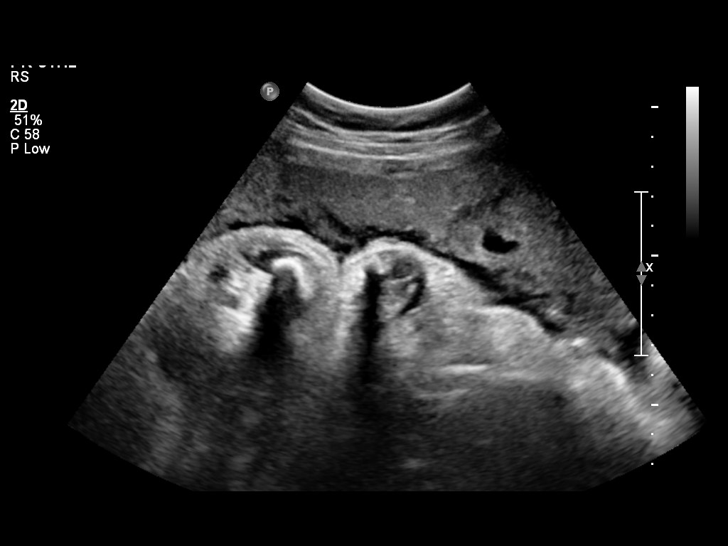
[im 8/41]
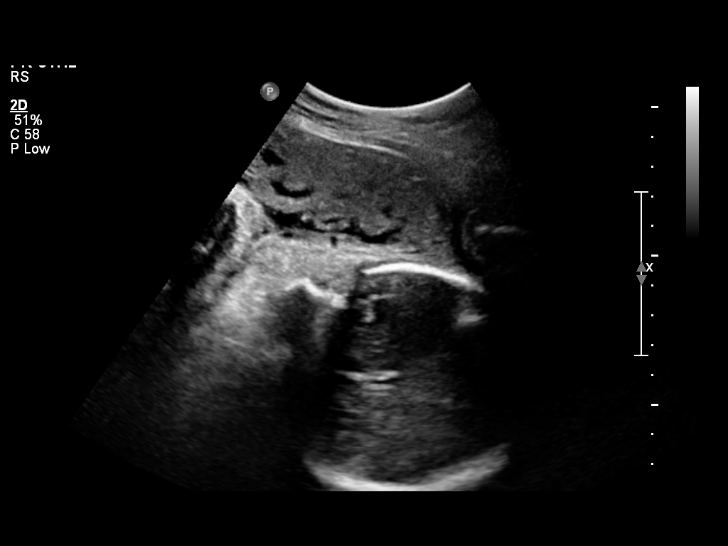
[im 12/41]
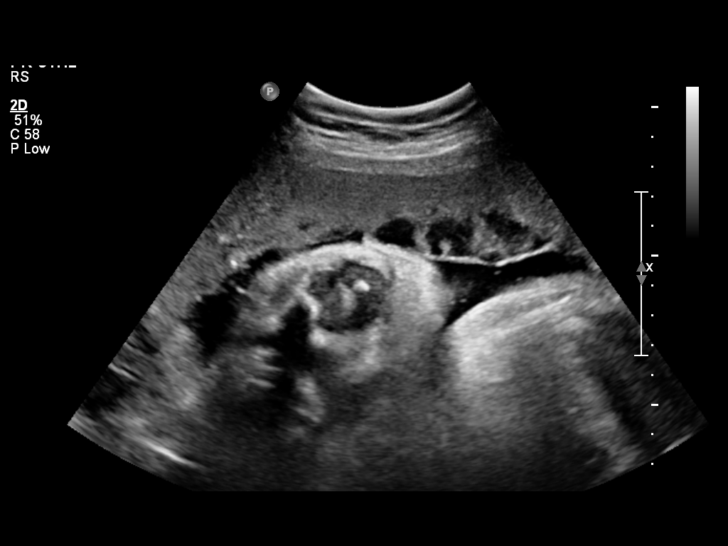
[im 15/41]
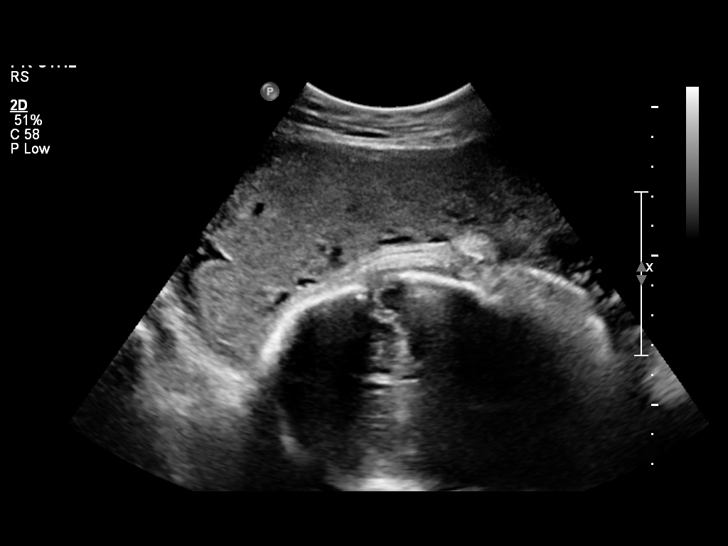
[im 18/41]
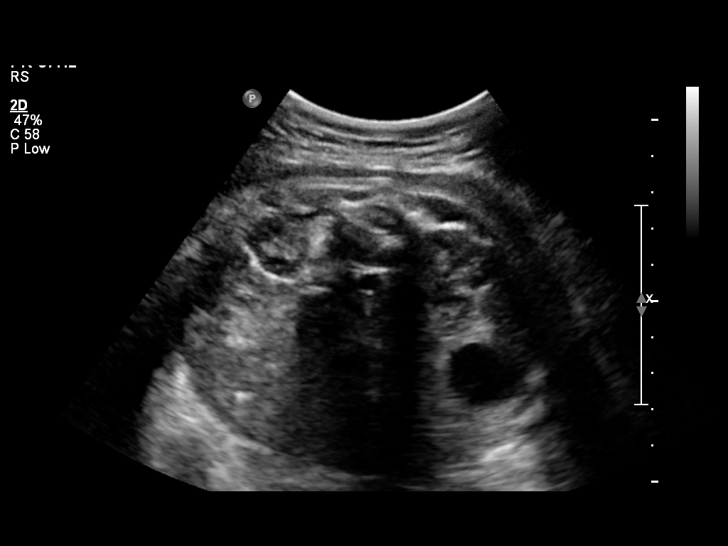
[im 23/41]
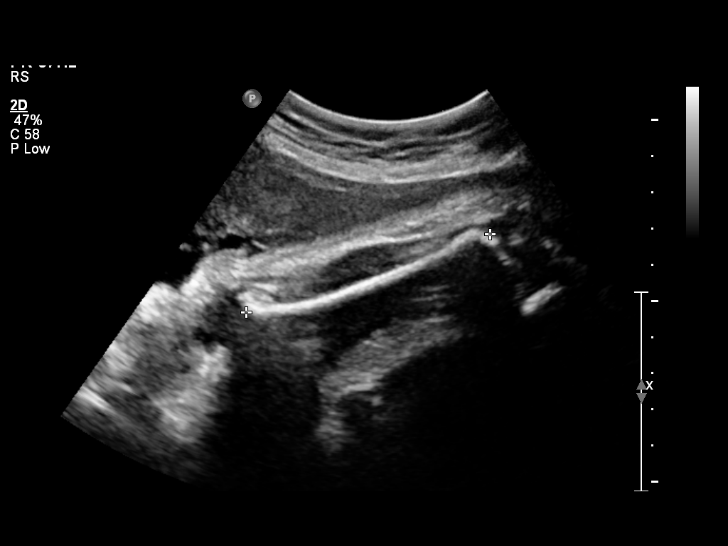
[im 26/41]
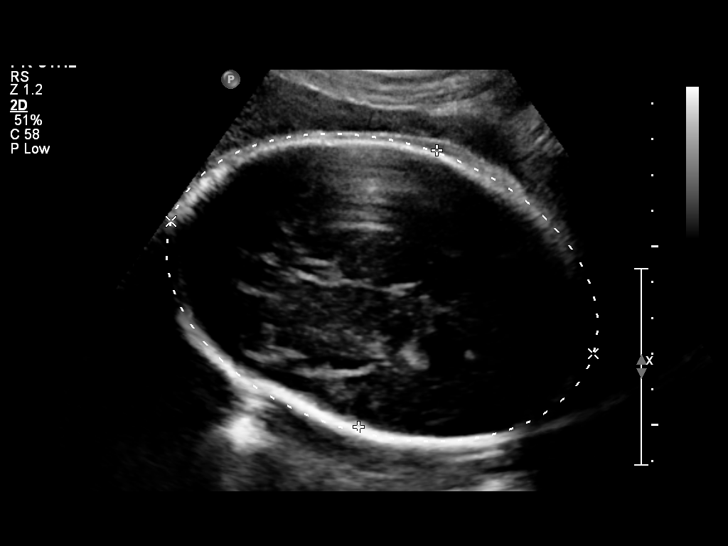
[im 29/41]
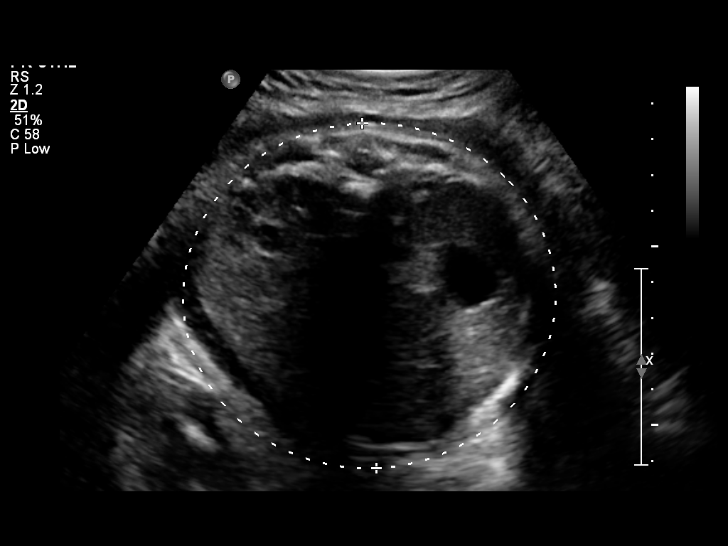
[im 33/41]
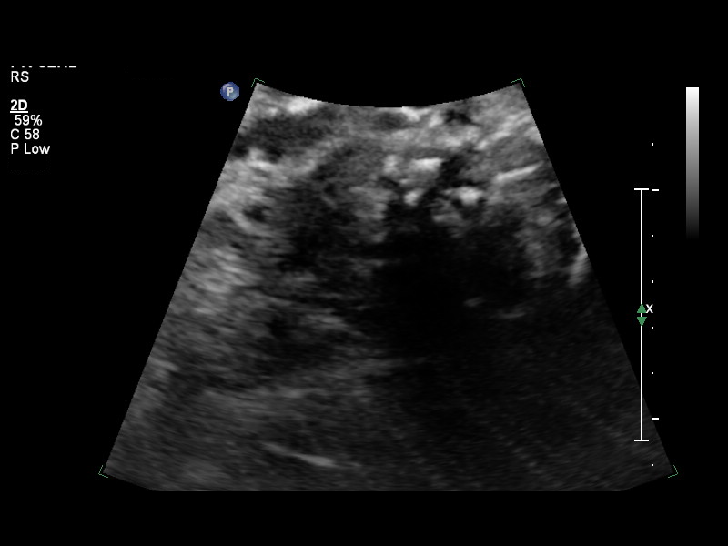
[im 36/41]
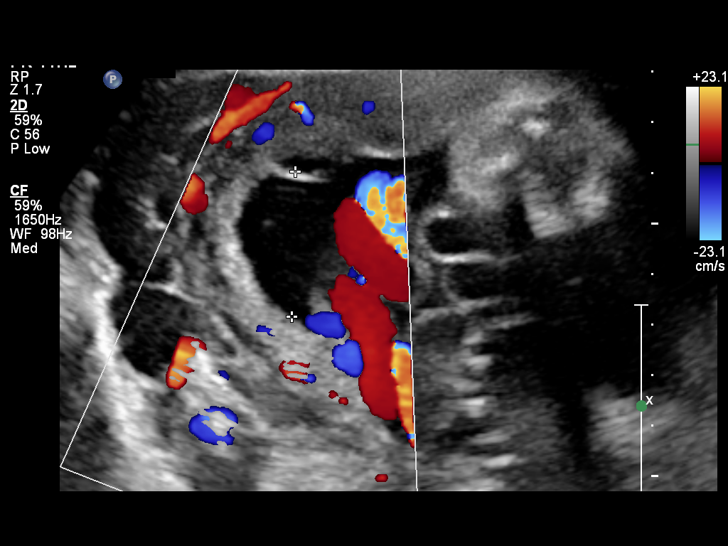
[im 39/41]
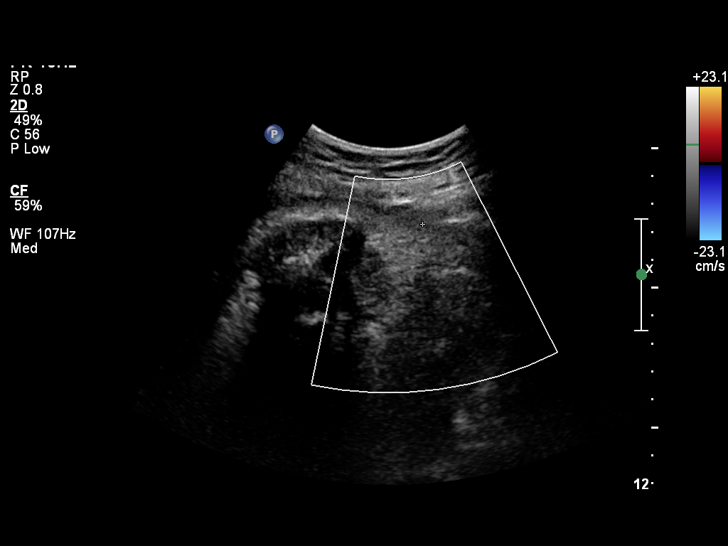

[12 of 28 positions shown; findings below may reference images not displayed]

OBSTETRICS REPORT
                      (Signed Final 07/13/2013 [DATE])

Service(s) Provided

 US OB FOLLOW UP                                       76816.1
Indications

 Size less than dates (Small for gestational [AGE]
 FGR)
Fetal Evaluation

 Num Of Fetuses:    1
 Fetal Heart Rate:  153                          bpm
 Cardiac Activity:  Observed
 Presentation:      Cephalic
 Placenta:          Anterior, above cervical os
 P. Cord            Visualized, central
 Insertion:

 Amniotic Fluid
 AFI FV:      Subjectively within normal limits
 AFI Sum:     7.86    cm        7  %Tile     Larg Pckt:    3.71  cm
 RUQ:   2.86    cm   RLQ:    3.71   cm    LUQ:   0       cm   LLQ:    1.29   cm
Biometry

 BPD:     79.1  mm     G. Age:  31w 5d                CI:        61.78   70 - 86
                                                      FL/HC:      21.8   20.8 -

 HC:     325.2  mm     G. Age:  36w 6d       26  %    HC/AC:      1.01   0.92 -

 AC:     320.8  mm     G. Age:  36w 0d       42  %    FL/BPD:     89.8   71 - 87
 FL:        71  mm     G. Age:  36w 3d       40  %    FL/AC:      22.1   20 - 24
 HUM:     61.7  mm     G. Age:  35w 5d       50  %
 Est. FW:    4101  gm      6 lb 1 oz     44  %
Gestational Age

 U/S Today:     35w 2d                                        EDD:   08/15/13
 Best:          36w 5d     Det. By:  U/S (04/09/13)           EDD:   08/05/13
Anatomy

 Cranium:          Appears normal         Aortic Arch:      Previously seen
 Fetal Cavum:      Previously seen        Ductal Arch:      Not well visualized
 Ventricles:       Appears normal         Diaphragm:        Previously seen
 Choroid Plexus:   Previously seen        Stomach:          Appears normal, left
                                                            sided
 Cerebellum:       Previously seen        Abdomen:          Previously seen
 Posterior Fossa:  Previously seen        Abdominal Wall:   Previously seen
 Nuchal Fold:      Previously seen        Cord Vessels:     Previously seen
 Face:             Orbits and profile     Kidneys:          Appear normal
                   previously seen
 Lips:             Previously seen        Bladder:          Appears normal
 Heart:            Previously seen        Spine:            Previously seen
 RVOT:             Previously seen        Lower             Previously seen
                                          Extremities:
 LVOT:             Previously seen        Upper             Previously seen
                                          Extremities:

 Other:  Nasal bone previously visualized. Heels and 5th digit previously
         visualized. Female gender. Technically difficult due to advanced GA.
Cervix Uterus Adnexa

 Cervix:       Not visualized (advanced GA >34 wks)
 Uterus:       No abnormality visualized.
 Cul De Sac:   No free fluid seen.

 Left Ovary:    Not visualized. No adnexal mass visualized.
 Right Ovary:   Not visualized. No adnexal mass visualized.
 Adnexa:     No abnormality visualized.
Impression

 Active single living intrauterine pregnancy at 36 weeks 5 days
 (remote read)
 Appropriate fetal growth (44%).
 Normal amniotic fluid volume (albeit, AFI is at 7th%'le;
 approaching lower limit of normal).
 interval fetal anatomy without apparent dysmorphic features
 No previa.
Recommendations

 I would recommend reassessment of the AFI in 1 week
 (please schedule).  If criteria for oligohydramnios are ever
 met from this gestational age onward, I would then
 recommend delivery (not met today).

 questions or concerns.

## 2015-04-24 MED FILL — DULoxetine HCL 30 MG CPEP: 30 | 30 days supply | Qty: 30 | Fill #1

## 2015-04-24 MED FILL — IBUPROFEN 800 MG TABLET: 800 | 5 days supply | Qty: 20 | Fill #0

## 2015-04-24 MED FILL — AMOXICILLIN 500 MG CAPSULE: 500 | 7 days supply | Qty: 20 | Fill #0

## 2015-05-05 ENCOUNTER — Other Ambulatory Visit: Payer: Self-pay | Admitting: Family Medicine

## 2016-03-28 IMAGING — US US ABDOMEN LIMITED
1 series · 14 of 25 positions shown · non-contrast
Comparison: None.

CLINICAL DATA: Right upper quadrant pain after eating.

EXAM:
US ABDOMEN LIMITED - RIGHT UPPER QUADRANT

[Series 1: us abdomen limited · 0.18mm/px · 14 of 34 slices shown]
[im 1/34]
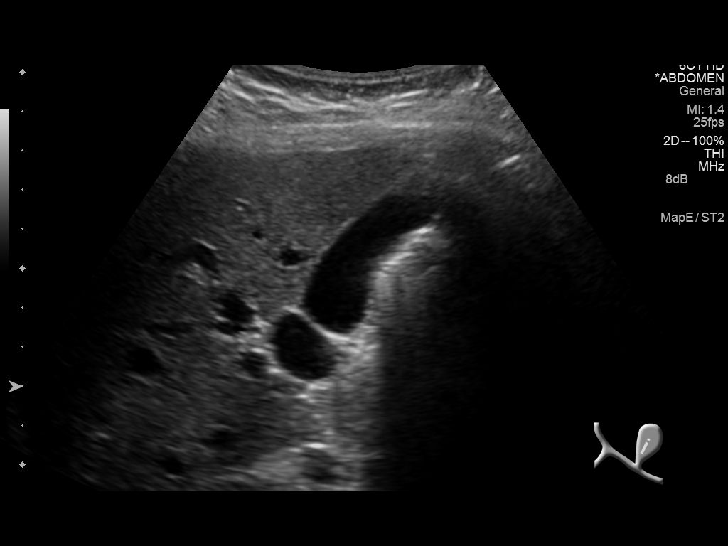
[im 3/34]
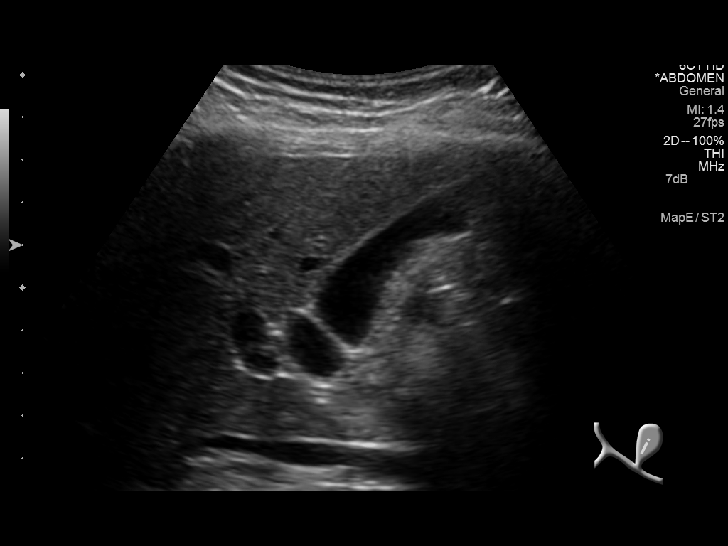
[im 6/34]
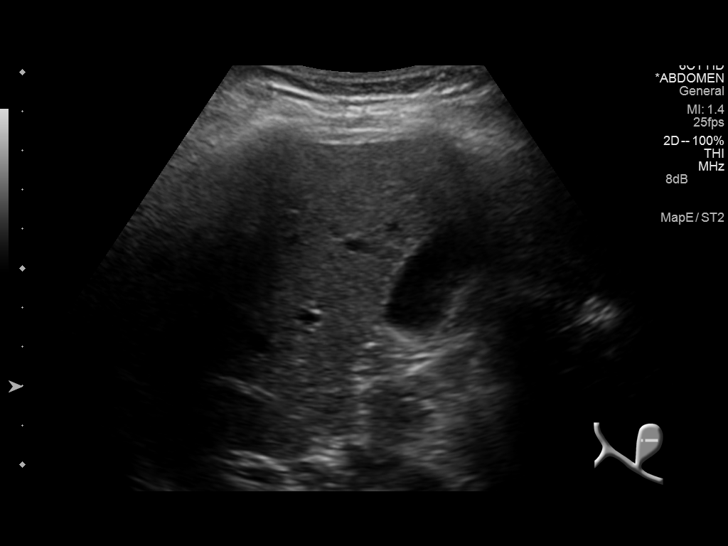
[im 9/34]
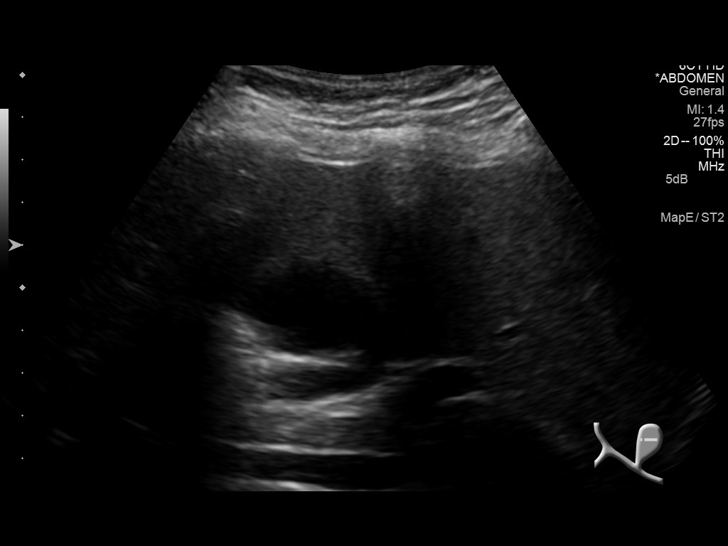
[im 12/34]
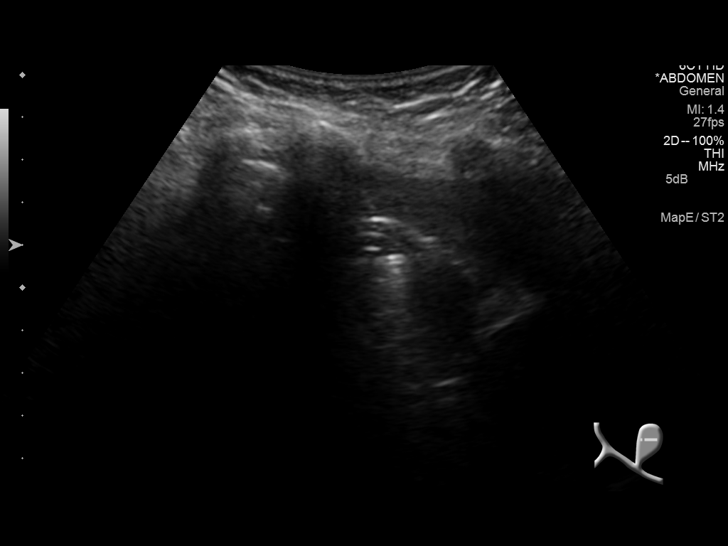
[im 13/34]
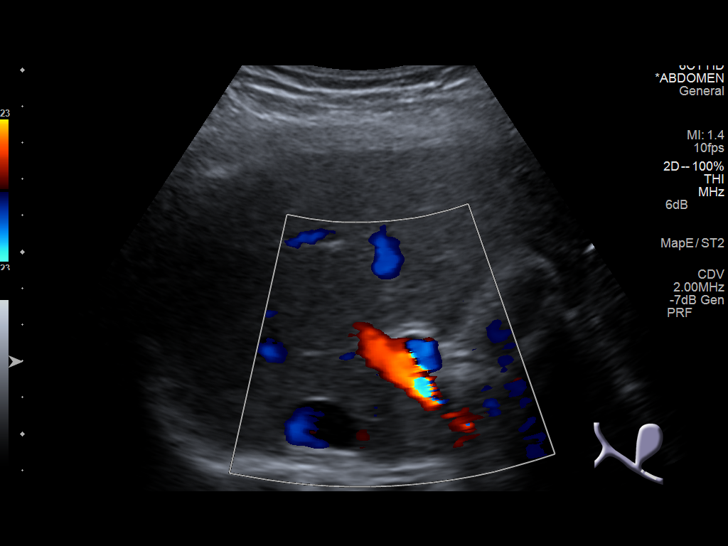
[im 16/34]
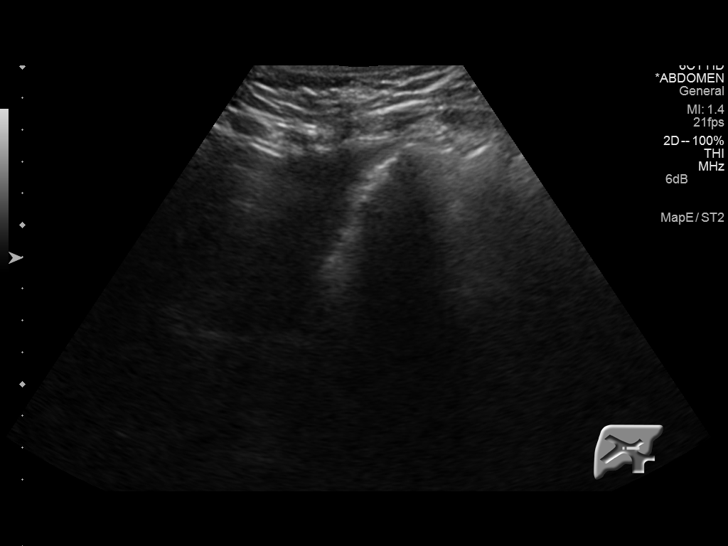
[im 18/34]
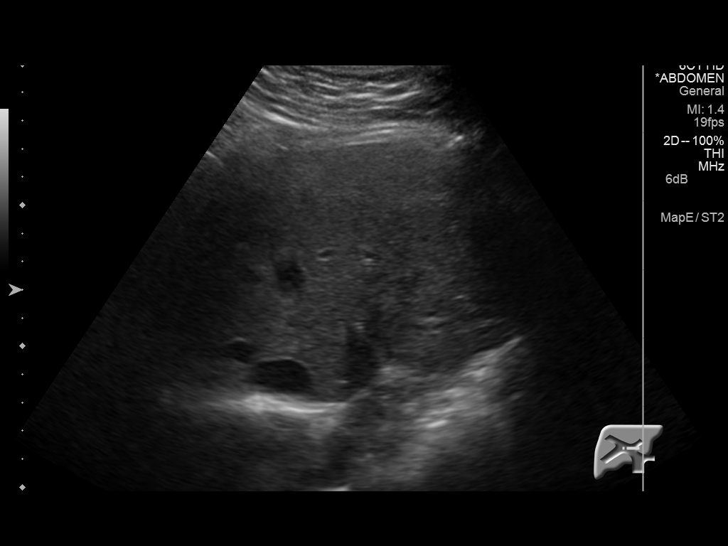
[im 21/34]
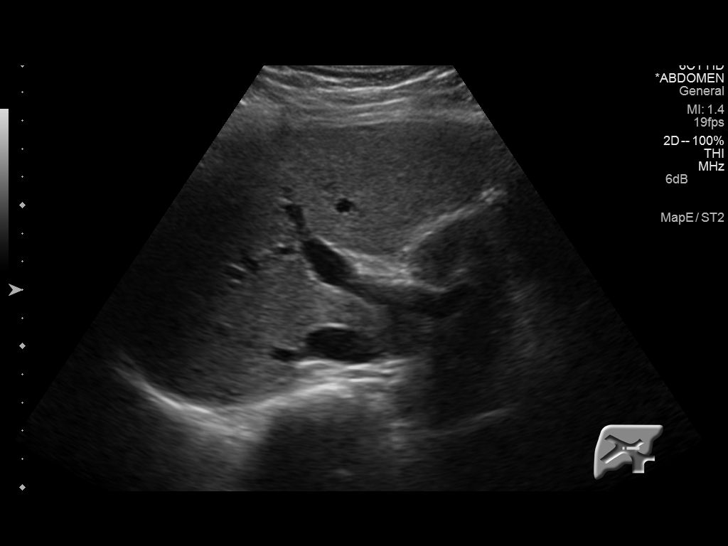
[im 23/34]
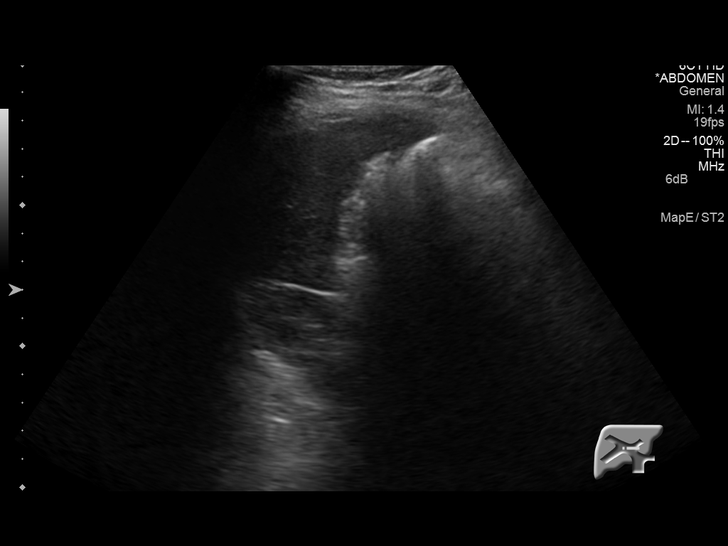
[im 25/34]
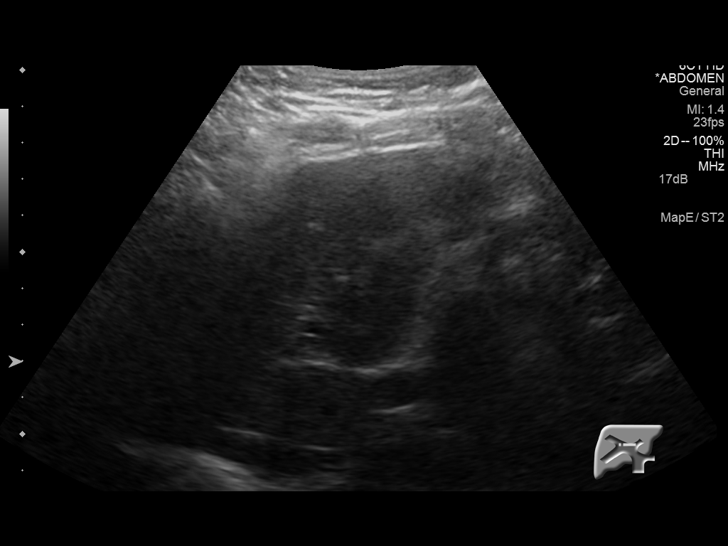
[im 28/34]
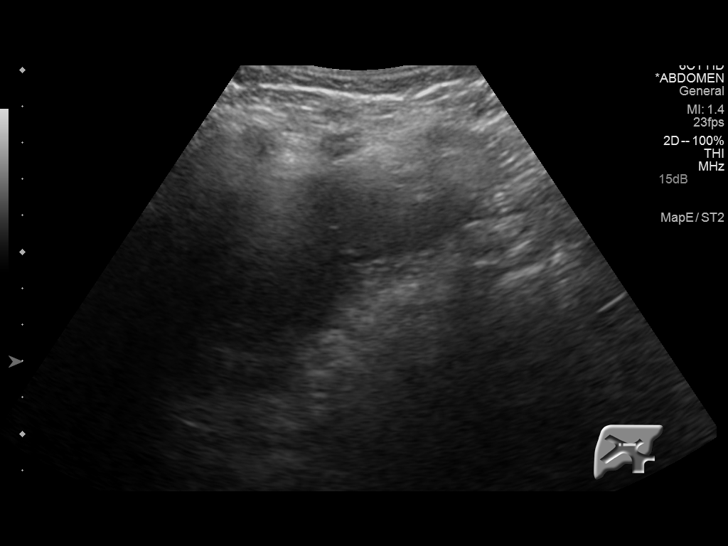
[im 31/34]
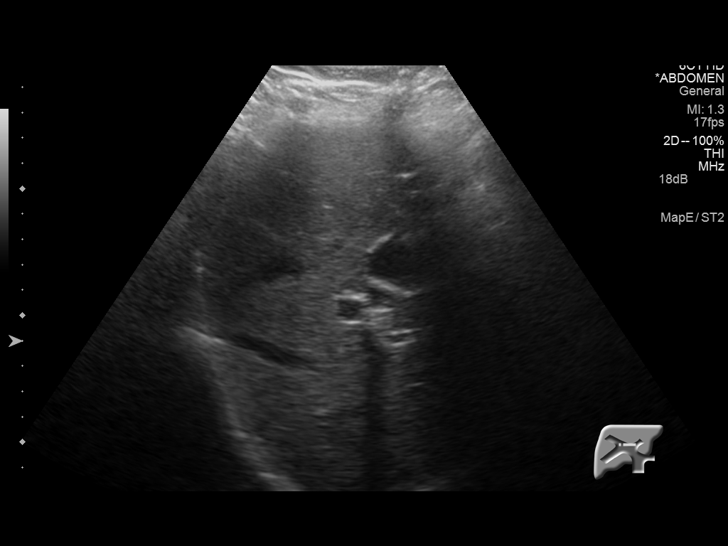
[im 34/34]
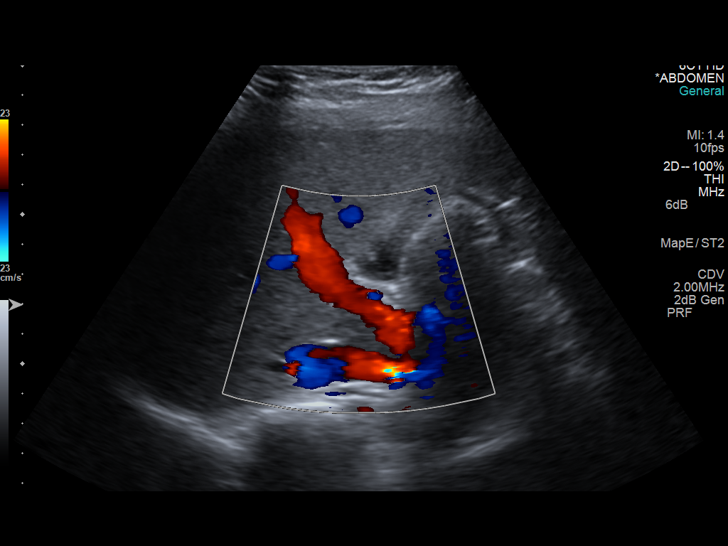

[14 of 25 positions shown; findings below may reference images not displayed]

FINDINGS: Gallbladder:

No gallstones or wall thickening visualized. Shadowing behind the
gallbladder is consistent with bowel gas. No sonographic Murphy sign
noted.

Common bile duct:

Diameter: 4 mm

Liver:

No focal lesion identified. Within normal limits in parenchymal
echogenicity.
IMPRESSION: Negative right upper quadrant ultrasound.

## 2016-10-25 ENCOUNTER — Encounter (INDEPENDENT_AMBULATORY_CARE_PROVIDER_SITE_OTHER): Payer: Self-pay | Admitting: Family Nurse Practitioner

## 2016-10-25 ENCOUNTER — Ambulatory Visit (INDEPENDENT_AMBULATORY_CARE_PROVIDER_SITE_OTHER): Payer: Self-pay | Admitting: Family Nurse Practitioner

## 2016-10-25 ENCOUNTER — Other Ambulatory Visit (INDEPENDENT_AMBULATORY_CARE_PROVIDER_SITE_OTHER): Payer: Self-pay

## 2016-10-25 VITALS — BP 104/69 | HR 80 | Temp 97.9°F | Resp 14 | Ht <= 58 in | Wt 117.6 lb

## 2016-10-25 DIAGNOSIS — R102 Pelvic and perineal pain: Secondary | ICD-10-CM | POA: Insufficient documentation

## 2016-10-25 DIAGNOSIS — M545 Low back pain, unspecified: Secondary | ICD-10-CM

## 2016-10-25 DIAGNOSIS — M4125 Other idiopathic scoliosis, thoracolumbar region: Secondary | ICD-10-CM

## 2016-10-25 DIAGNOSIS — R1013 Epigastric pain: Secondary | ICD-10-CM

## 2016-10-25 DIAGNOSIS — Z0000 Encounter for general adult medical examination without abnormal findings: Secondary | ICD-10-CM

## 2016-10-25 DIAGNOSIS — G44209 Tension-type headache, unspecified, not intractable: Secondary | ICD-10-CM

## 2016-10-25 DIAGNOSIS — R519 Headache, unspecified: Secondary | ICD-10-CM | POA: Insufficient documentation

## 2016-10-25 DIAGNOSIS — Z0289 Encounter for other administrative examinations: Secondary | ICD-10-CM

## 2016-10-25 DIAGNOSIS — H538 Other visual disturbances: Secondary | ICD-10-CM | POA: Insufficient documentation

## 2016-10-25 DIAGNOSIS — M419 Scoliosis, unspecified: Secondary | ICD-10-CM | POA: Insufficient documentation

## 2016-10-25 DIAGNOSIS — K219 Gastro-esophageal reflux disease without esophagitis: Secondary | ICD-10-CM | POA: Insufficient documentation

## 2016-10-25 LAB — TSH: TSH: 0.82 u[IU]/mL (ref 0.35–4.94)

## 2016-10-25 LAB — CBC
Absolute NRBC: 0 10*3/uL
Hematocrit: 41.7 % (ref 37.0–47.0)
Hgb: 13.6 g/dL (ref 12.0–16.0)
MCH: 28.8 pg (ref 28.0–32.0)
MCHC: 32.6 g/dL (ref 32.0–36.0)
MCV: 88.3 fL (ref 80.0–100.0)
MPV: 10.3 fL (ref 9.4–12.3)
Nucleated RBC: 0 /100 WBC (ref 0.0–1.0)
Platelets: 294 10*3/uL (ref 140–400)
RBC: 4.72 10*6/uL (ref 4.20–5.40)
RDW: 13 % (ref 12–15)
WBC: 6.82 10*3/uL (ref 3.50–10.80)

## 2016-10-25 LAB — COMPREHENSIVE METABOLIC PANEL
ALT: 20 U/L (ref 0–55)
AST (SGOT): 16 U/L (ref 5–34)
Albumin/Globulin Ratio: 1.4 (ref 0.9–2.2)
Albumin: 4.3 g/dL (ref 3.5–5.0)
Alkaline Phosphatase: 76 U/L (ref 37–106)
BUN: 9 mg/dL (ref 7.0–19.0)
Bilirubin, Total: 1 mg/dL (ref 0.1–1.2)
CO2: 25 mEq/L (ref 21–29)
Calcium: 9.8 mg/dL (ref 8.5–10.5)
Chloride: 106 mEq/L (ref 100–111)
Creatinine: 0.7 mg/dL (ref 0.4–1.5)
Globulin: 3 g/dL (ref 2.0–3.7)
Glucose: 88 mg/dL (ref 70–100)
Potassium: 4.2 mEq/L (ref 3.5–5.1)
Protein, Total: 7.3 g/dL (ref 6.0–8.3)
Sodium: 141 mEq/L (ref 136–145)

## 2016-10-25 LAB — LIPID PANEL
Cholesterol / HDL Ratio: 3.3
Cholesterol: 146 mg/dL (ref 0–199)
HDL: 44 mg/dL (ref 40–9999)
LDL Calculated: 88 mg/dL (ref 0–99)
Triglycerides: 72 mg/dL (ref 34–149)
VLDL Calculated: 14 mg/dL (ref 10–40)

## 2016-10-25 LAB — HIV AG/AB 4TH GENERATION: HIV Ag/Ab, 4th Generation: NONREACTIVE

## 2016-10-25 LAB — HEMOGLOBIN A1C
Average Estimated Glucose: 102.5 mg/dL
Hemoglobin A1C: 5.2 % (ref 4.6–5.9)

## 2016-10-25 LAB — HEMOLYSIS INDEX: Hemolysis Index: 5 (ref 0–18)

## 2016-10-25 LAB — GFR: EGFR: >60

## 2016-10-25 MED ORDER — CYCLOBENZAPRINE HCL 5 MG PO TABS
5.0000 mg | ORAL_TABLET | Freq: Every evening | ORAL | 2 refills | Status: AC
Start: 2016-10-25 — End: ?

## 2016-10-25 MED ORDER — OMEPRAZOLE 20 MG PO CPDR
20.0000 mg | DELAYED_RELEASE_CAPSULE | Freq: Two times a day (BID) | ORAL | 2 refills | Status: AC
Start: 2016-10-25 — End: ?

## 2016-10-25 NOTE — Patient Instructions (Signed)
Please get labs done today.    Continue to take Acetaminophen for headache    Try Flexeril at bedtime for neck and back pain.    Please get the MRI done    Please get eye exam done    Take Omeprazole twice daily before breakfast and dinner.    Return in 1 week for papsmear and lab review.         Por favor, haga los laboratorios hoy.     Siga tomando acetaminofeno para el dolor de cabeza     Pruebe Flexeril a la hora de acostarse para el dolor de cuello y espalda.     Por favor haga la resonancia magntica     Por favor haga una cita for examen de los ojos    Tome Omeprazole dos veces al da antes del desayuno y Physicist, medical.     Regrese en 1 semana para una revisin de laboratorio y revisin de Interior and spatial designer.

## 2016-10-25 NOTE — Progress Notes (Signed)
Pamela Butler                             [facility]  [address]         [address]                     [address]  [address]                            [address]                                 [address]   [place], [place] [address]             [place], [place] [address]              [place], [place] [address]  [phone]                     [phone]                          [phone]       History of Present Illness:     This patient is a 41 y.o. female with no sig PMH.  Here to establish care / CPE.     Pt has the following issues today:    Headache x 2 years - starts at nape of neck radiating up to the top of head.  Pain also present in the forehead.  Headache is constant, described as pressure.   Tylenol provides some relief.  Denies dizziness, nausea, vomiting, sensitivity to light or sound.  Pt notes 1 month h/o blurry vision, sees black and white "strips".    Abdominal pain at belly button - radiating to the lower back  x 2-3 weeks.  Also has epigastric pain and acid reflux.  Also has pelvic pain    Feet pain and legs shiver  Has some numbness on bottom of feet.   No h/o back injuries. No bladder or bowel incontinence.  Did not have similar symptoms in past.    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    Mammogram - non prior   Papsmear - 3 yrs ago, no h/o abnormal pap.   Tdap - Utd      POCT pregnancy test - negative  POCT urine dipstick - negative    Review of Systems:     Review of Systems   Constitutional: Negative.    HENT: Negative.    Eyes: Positive for blurred vision.   Respiratory: Negative.    Cardiovascular: Negative.    Gastrointestinal: Positive for abdominal pain.   Genitourinary: Negative.    Musculoskeletal: Positive for back pain.   Skin: Negative.    Neurological: Positive for headaches.   Endo/Heme/Allergies: Negative.    Psychiatric/Behavioral: Negative.         Spanish interpreter [phone]     Physical Exam:     Vitals:    10/25/16 0855   BP: 104/69   Pulse: 80   Resp: 14   Temp: 97.9  F (36.6 C)   SpO2: 99%       Wt Readings from Last 3 Encounters:   10/25/16 53.3 kg (117 lb 9.6 oz)   03/10/11 93 kg (205 lb)       General: awake, alert, oriented x 3  HEENT: perrla, eomi, sclera anicteric,oropharynx clear without lesions, mucous membranes moist  Neck: supple, no lymphadenopathy, no thyromegaly, no JVD, no carotid bruits  Cardiovascular: S1, S2, regular rate and rhythm, no murmurs, rubs, or gallops  Lungs: clear to auscultation bilaterally, without wheezing, rhonchi, or rales  Abdomen: soft, + tenderness in epigastrium, umbilicus and pelvis, normal bowel sounds  Extremities: no clubbing, cyanosis, or edema  Musk: Moderate scoliosis of thoracic spine  Neuro: cranial nerves grossly intact, strength 5/5 in upper and lower extremities, sensation intact  Skin: no rashes or lesions noted    Diagnostics:     Lab Results   Component Value Date    WBC 9.23 03/19/2011    HGB 14.0 03/19/2011    HCT 40.9 03/19/2011    PLT 297 03/19/2011    ALT 40 (H) 03/19/2011    AST 25 03/19/2011    NA 146 03/19/2011    K 4.0 03/19/2011    CL 108 (H) 03/19/2011    CREAT 0.6 03/19/2011    BUN 7 (L) 03/19/2011    CO2 24 03/19/2011    GLU 104 (H) 03/19/2011       No results found.    Assessment:     Patient Active Problem List   Diagnosis   . Annual physical exam   . Headache   . Epigastric pain   . Gastroesophageal reflux disease without esophagitis   . Pelvic pain   . Scoliosis of thoracolumbar spine   . Blurred vision, bilateral       Plan:     Pamela Butler was seen today for annual exam.    Diagnoses and all orders for this visit:    Acute bilateral low back pain without sciatica  -     MRI lumbar spine without contrast  -     cyclobenzaprine (FLEXERIL) 5 MG tablet; Take 1 tablet (5 mg total) by mouth nightly.    Annual physical exam  -     CBC w/o diff; Future  -     Comprehensive Metabolic Panel; Future  -     Hemoglobin A1C; Future   -     HIV Ag/Ab 4th generation; Future  -     Lipid panel; Future  -     TSH; Future    Acute non intractable tension-type headache  -     MRI cervical spine without contrast    Epigastric pain  -     H. pylori breath test; Future  -     omeprazole (PRILOSEC) 20 MG capsule; Take 1 capsule (20 mg total) by mouth 2 (two) times daily.    Gastroesophageal reflux disease without esophagitis  -     H. pylori breath test; Future  -     omeprazole (PRILOSEC) 20 MG capsule; Take 1 capsule (20 mg total) by mouth 2 (two) times daily.    Pelvic pain  -     POCT UA  Automated (urine dipstick)  -     POCT pregnancy, urine    Other idiopathic scoliosis, thoracolumbar region    Blurred vision, bilateral  -     Ambulatory referral to Ophthalmology        1.  CPE -  check labs as above  -return in 1 week for papsmear  -will order mammogram at next visit.  2. Headache / neck pain - ordered MRI cervical spine w/o contrast. Trial Flexeril qhs.  3. Low back pain - ordered MRI lumbar spine w/o contrast. Trial Flexeril qhs.  4. Abdominal pain - check H. Pylori breath test. Omeprazole 20mg  Bid before meals.  5. Blurred vision - referred to ophthalmology, gave info for [facility]  6. Scoliosis - chronic.       FOLLOW-UP PLAN: 1 week for papsmear, lab review and f/u. Pt verbalized understanding of the above and agreed to plan of care.

## 2016-10-26 LAB — H. PYLORI BREATH TEST: H. pylori Breath Test: NOT DETECTED

## 2016-11-01 ENCOUNTER — Ambulatory Visit (INDEPENDENT_AMBULATORY_CARE_PROVIDER_SITE_OTHER): Payer: Self-pay

## 2016-11-01 ENCOUNTER — Ambulatory Visit (INDEPENDENT_AMBULATORY_CARE_PROVIDER_SITE_OTHER): Payer: Self-pay | Admitting: Family Nurse Practitioner

## 2016-11-01 VITALS — BP 102/67 | HR 85 | Temp 98.2°F | Resp 16 | Wt 119.8 lb

## 2016-11-01 DIAGNOSIS — Z0000 Encounter for general adult medical examination without abnormal findings: Secondary | ICD-10-CM

## 2016-11-01 DIAGNOSIS — R102 Pelvic and perineal pain: Secondary | ICD-10-CM

## 2016-11-01 DIAGNOSIS — G44209 Tension-type headache, unspecified, not intractable: Secondary | ICD-10-CM

## 2016-11-01 DIAGNOSIS — K219 Gastro-esophageal reflux disease without esophagitis: Secondary | ICD-10-CM

## 2016-11-01 DIAGNOSIS — H538 Other visual disturbances: Secondary | ICD-10-CM

## 2016-11-01 DIAGNOSIS — R1013 Epigastric pain: Secondary | ICD-10-CM

## 2016-11-01 DIAGNOSIS — M4125 Other idiopathic scoliosis, thoracolumbar region: Secondary | ICD-10-CM

## 2016-11-01 NOTE — Progress Notes (Signed)
SW met with patient following NP visit to assist with charity application. Application signed and faxed to Financial Aid Office

## 2016-11-01 NOTE — Progress Notes (Signed)
[place]                             [facility]  [address]         [address]                     [address]  [address]                            [address]                                 [address]   [place], [place] [address]             [place], [place] [address]              [place], [place] [address]  [phone]                     [phone]                          [phone]       History of Present Illness:     This patient is a 41 y.o. female with no sig PMH.  Here today for follow up, papsmear and lab review.    All labs were WNL    Last pap 3 yrs ago, no h/o abnormal pap.  Needs order for mammogram today    Headaches / neck pain - slightly improved. Tylenol and Flexeril provides some relief.    Has MRI cervical spine scheduled for tomorrow.     Abdominal pain -  improved with omeprazole. H pylori test negative.    Back pain / feet numbness - back pain improved - Flexeril helps.  Has lumabar MRI scheduled for tomorrow.      Review of Systems:     Review of Systems   Constitutional: Negative.    HENT: Negative.    Eyes: Positive for blurred vision.   Respiratory: Negative.    Cardiovascular: Negative.    Gastrointestinal: Positive for abdominal pain.   Genitourinary: Negative.    Musculoskeletal: Positive for back pain.   Skin: Negative.    Endo/Heme/Allergies: Negative.    Psychiatric/Behavioral: Negative.         Physical Exam:     Vitals:    11/01/16 0925   BP: 102/67   Pulse: 85   Resp: 16   Temp: 98.2 F (36.8 C)   SpO2: 99%       Wt Readings from Last 3 Encounters:   11/01/16 54.3 kg (119 lb 12.8 oz)   10/25/16 53.3 kg (117 lb 9.6 oz)   03/10/11 93 kg (205 lb)       General: awake, alert, oriented x 3  HEENT: perrla, eomi, sclera anicteric,oropharynx clear without lesions, mucous membranes moist  Neck: supple, no lymphadenopathy, no thyromegaly, no JVD, no carotid bruits  Cardiovascular: S1, S2, regular rate and rhythm, no murmurs, rubs, or  gallops  Lungs: clear to auscultation bilaterally, without wheezing, rhonchi, or rales  Abdomen: soft, mild pelvic tenderness, normal bowel sounds  GU:  normal external genitalia, normal multiparous cervix, no adnexal mass or CMT.  Extremities: no clubbing, cyanosis, or edema  Musk: Moderate scoliosis of thoracic spine  Neuro: cranial nerves grossly intact, strength 5/5 in upper and lower extremities, sensation intact  Skin: no rashes or lesions noted    Diagnostics:     Lab Results   Component Value Date    WBC 6.82 10/25/2016    HGB 13.6 10/25/2016    HCT 41.7 10/25/2016    PLT 294 10/25/2016    CHOL 146 10/25/2016    TRIG 72 10/25/2016    HDL 44 10/25/2016    LDL 88 10/25/2016    ALT 20 10/25/2016    AST 16 10/25/2016    NA 141 10/25/2016    K 4.2 10/25/2016    CL 106 10/25/2016    CREAT 0.7 10/25/2016    BUN 9.0 10/25/2016    CO2 25 10/25/2016    TSH 0.82 10/25/2016    GLU 88 10/25/2016    HGBA1C 5.2 10/25/2016       No results found.    Assessment:     Patient Active Problem List   Diagnosis   . Annual physical exam   . Headache   . Epigastric pain   . Gastroesophageal reflux disease without esophagitis   . Pelvic pain   . Scoliosis of thoracolumbar spine   . Blurred vision, bilateral       Plan:     Joley was seen today for follow-up.    Diagnoses and all orders for this visit:    Annual physical exam  -     Pap Smear, Thin Prep w/ reflex to HR HPV; Future  -     Mammo Digital Screening Bilateral W CAD  -     Pap Smear, Thin Prep w/ reflex to HR HPV    Gastroesophageal reflux disease without esophagitis    Other idiopathic scoliosis, thoracolumbar region    Blurred vision, bilateral    Epigastric pain    Acute non intractable tension-type headache    Pelvic pain        1.  GYN exam - papsmear done today, pt tolerated well.  -order given for mammogram.  2. Headache / neck pain - improved, cont Flexeril and Tylenol.    -MRI cervical spine scheduled for tomorrow.  3. Low back pain - improved, MRI to be done  tomorrow.  Cont Flexeril.  4. Abdominal pain - cont Omeprazole.  5. Blurred vision - referred to ophthalmology, in process connecting to [facility].  6. Scoliosis - chronic.       FOLLOW-UP PLAN: Pending las/imaging, will cal with results. Pt verbalized understanding of the above and agreed to plan of care.

## 2016-11-01 NOTE — Patient Instructions (Signed)
Please schedule for mammogram    We will call you in 7-10 days if papsmear is abnormal.    Continue taking medications for back pain and abdominal pain.      Por favor programar para mamografa    Duke Weisensel llamaremos dentro de 7-10 das si la papsmear es anormal.    Contine tomando medicamentos para el dolor de espalda y el dolor abdominal.

## 2016-11-02 ENCOUNTER — Ambulatory Visit: Payer: Charity | Attending: Family Nurse Practitioner

## 2016-11-02 ENCOUNTER — Ambulatory Visit: Payer: Charity

## 2016-11-02 DIAGNOSIS — G44209 Tension-type headache, unspecified, not intractable: Secondary | ICD-10-CM | POA: Insufficient documentation

## 2016-11-02 DIAGNOSIS — M47812 Spondylosis without myelopathy or radiculopathy, cervical region: Secondary | ICD-10-CM | POA: Insufficient documentation

## 2016-11-02 DIAGNOSIS — M4727 Other spondylosis with radiculopathy, lumbosacral region: Secondary | ICD-10-CM | POA: Insufficient documentation

## 2016-11-02 DIAGNOSIS — M542 Cervicalgia: Secondary | ICD-10-CM | POA: Insufficient documentation

## 2016-11-02 DIAGNOSIS — M4182 Other forms of scoliosis, cervical region: Secondary | ICD-10-CM | POA: Insufficient documentation

## 2016-11-02 DIAGNOSIS — M4186 Other forms of scoliosis, lumbar region: Secondary | ICD-10-CM | POA: Insufficient documentation

## 2016-11-02 DIAGNOSIS — M4726 Other spondylosis with radiculopathy, lumbar region: Secondary | ICD-10-CM | POA: Insufficient documentation

## 2016-11-05 LAB — PAP SMEAR, THIN PREP W/ REFLEX TO HR HPV

## 2017-03-19 ENCOUNTER — Ambulatory Visit (INDEPENDENT_AMBULATORY_CARE_PROVIDER_SITE_OTHER): Payer: Charity | Admitting: Family Nurse Practitioner
